# Patient Record
Sex: Female | Born: 1995 | Race: White | Hispanic: Yes | Marital: Single | Smoking: Never smoker
Health system: Southern US, Community
[De-identification: ages and names within clinical notes are randomized; demographics above are authoritative.]

## PROBLEM LIST (undated history)

## (undated) DIAGNOSIS — J302 Other seasonal allergic rhinitis: Secondary | ICD-10-CM

## (undated) DIAGNOSIS — K219 Gastro-esophageal reflux disease without esophagitis: Secondary | ICD-10-CM

## (undated) HISTORY — PX: NO PAST SURGERIES: SHX2092

---
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
## 2017-01-18 ENCOUNTER — Emergency Department (HOSPITAL_COMMUNITY): Payer: Medicaid Other

## 2017-01-18 ENCOUNTER — Emergency Department (HOSPITAL_COMMUNITY)
Admission: EM | Admit: 2017-01-18 | Discharge: 2017-01-18 | Disposition: A | Discharge status: Home / Self Care | Payer: Medicaid Other | Attending: Emergency Medicine | Admitting: Emergency Medicine

## 2017-01-18 ENCOUNTER — Encounter (HOSPITAL_COMMUNITY): Payer: Self-pay | Admitting: *Deleted

## 2017-01-18 DIAGNOSIS — Z79899 Other long term (current) drug therapy: Secondary | ICD-10-CM | POA: Insufficient documentation

## 2017-01-18 DIAGNOSIS — Z3A01 Less than 8 weeks gestation of pregnancy: Secondary | ICD-10-CM | POA: Insufficient documentation

## 2017-01-18 DIAGNOSIS — N939 Abnormal uterine and vaginal bleeding, unspecified: Secondary | ICD-10-CM

## 2017-01-18 DIAGNOSIS — O200 Threatened abortion: Secondary | ICD-10-CM | POA: Diagnosis not present

## 2017-01-18 DIAGNOSIS — O208 Other hemorrhage in early pregnancy: Secondary | ICD-10-CM | POA: Diagnosis present

## 2017-01-18 LAB — URINALYSIS, ROUTINE W REFLEX MICROSCOPIC
Bilirubin Urine: NEGATIVE
GLUCOSE, UA: NEGATIVE mg/dL
KETONES UR: NEGATIVE mg/dL
Leukocytes, UA: NEGATIVE
NITRITE: NEGATIVE
PROTEIN: NEGATIVE mg/dL
Specific Gravity, Urine: 1.015 (ref 1.005–1.030)
pH: 7 (ref 5.0–8.0)

## 2017-01-18 LAB — POC URINE PREG, ED: PREG TEST UR: POSITIVE — AB

## 2017-01-18 LAB — WET PREP, GENITAL
Sperm: NONE SEEN
TRICH WET PREP: NONE SEEN
Yeast Wet Prep HPF POC: NONE SEEN

## 2017-01-18 LAB — HCG, QUANTITATIVE, PREGNANCY: hCG, Beta Chain, Quant, S: 13232 m[IU]/mL — ABNORMAL HIGH (ref <5)

## 2017-01-18 LAB — ABO/RH: ABO/RH(D): B POS

## 2017-01-18 NOTE — ED Triage Notes (Signed)
Pt noticed vaginal bleeding when she wiped after urinating today. Pt states she has some lower abdominal cramping today as well. Pt is [redacted] weeks pregnant.

## 2017-01-18 NOTE — ED Notes (Signed)
Pt aware awaiting for RH screening to result.

## 2017-01-18 NOTE — Discharge Instructions (Signed)
Make an appointment at the [facility] outpatient clinic to get a repeat ultrasound and repeat hCG test in 2 weeks. Your blood type is B+. Please memorize this. If you develop worsening bleeding, cramping, lightheadedness go immediately to the maternity admissions unit at [facility]

## 2017-01-18 NOTE — ED Provider Notes (Signed)
[facility]-EMERGENCY DEPT Provider Note   CSN: [id number] Arrival date & time: 01/18/17  1419     History   Chief Complaint Chief Complaint  Patient presents with  . Vaginal Bleeding    HPI Casey Manning is a 21 y.o. female.Patient noticed slight amount of vaginal bleeding this morning and had 10 minutes of crampy lower abdominal pain she is presently asymptomatic without treatment no other associated symptoms she reports being [redacted] weeks pregnant. Had first prenatal visit yesterday. No other associated symptoms.  HPI  Past Medical History:  Diagnosis Date  . Seasonal allergies     There are no active problems to display for this patient.   History reviewed. No pertinent surgical history.  OB History    Gravida Para Term Preterm AB Living   1             SAB TAB Ectopic Multiple Live Births                   Home Medications    Prior to Admission medications   Medication Sig Start Date End Date Taking? Authorizing Provider  ibuprofen (ADVIL,MOTRIN) 600 MG tablet Take 1 tablet (600 mg total) by mouth every 8 (eight) hours as needed for moderate pain. 12/05/16  Yes Shaune Pollack, MD  Prenatal Vit-Fe Fumarate-FA (PRENATAL 1 PLUS 1 PO) Take 1 tablet by mouth daily.   Yes Historical Provider, MD    Family History No family history on file.  Social History Social History  Substance Use Topics  . Smoking status: Never Smoker  . Smokeless tobacco: Never Used  . Alcohol use No   Ex-smoker quit a few weeks ago no drug use no alcohol  Allergies   Patient has no known allergies.   Review of Systems Review of Systems  Constitutional: Negative.   HENT: Negative.   Respiratory: Negative.   Cardiovascular: Negative.   Gastrointestinal: Positive for abdominal pain.       Low abdominal pain-resolved  Genitourinary: Positive for vaginal bleeding.       Pregnant  Musculoskeletal: Negative.   Skin: Negative.   Neurological: Negative.   Psychiatric/Behavioral:  Negative.   All other systems reviewed and are negative.    Physical Exam Updated Vital Signs BP (!) 121/48 (BP Location: Right Arm)   Pulse 73   Temp 97.8 F (36.6 C) (Oral)   Resp 18   Ht  (1.575 m)   Wt 191 lb 9 oz (86.9 kg)   LMP 12/04/2016   SpO2 100%   BMI 35.04 kg/m   Physical Exam  Constitutional: She appears well-developed and well-nourished.  HENT:  Head: Normocephalic and atraumatic.  Eyes: Conjunctivae are normal. Pupils are equal, round, and reactive to light.  Neck: Neck supple. No tracheal deviation present. No thyromegaly present.  Cardiovascular: Normal rate and regular rhythm.   No murmur heard. Pulmonary/Chest: Effort normal and breath sounds normal.  Abdominal: Soft. Bowel sounds are normal. She exhibits no distension. There is no tenderness.  Genitourinary:  Genitourinary Comments: Pelvic exam no external lesion. Cervical os closed. No blood or discharge in vault. No cervical motion tenderness no adnexal masses or tenderness  Musculoskeletal: Normal range of motion. She exhibits no edema or tenderness.  Neurological: She is alert. Coordination normal.  Skin: Skin is warm and dry. No rash noted.  Psychiatric: She has a normal mood and affect.  Nursing note and vitals reviewed.    ED Treatments / Results  Labs (all labs ordered are listed,  but only abnormal results are displayed) Labs Reviewed  WET PREP, GENITAL - Abnormal; Notable for the following:       Result Value   Clue Cells Wet Prep HPF POC PRESENT (*)    WBC, Wet Prep HPF POC FEW (*)    All other components within normal limits  URINALYSIS, ROUTINE W REFLEX MICROSCOPIC - Abnormal; Notable for the following:    Hgb urine dipstick MODERATE (*)    Bacteria, UA RARE (*)    Squamous Epithelial / LPF 6-30 (*)    All other components within normal limits  HCG, QUANTITATIVE, PREGNANCY - Abnormal; Notable for the following:    hCG, Beta Chain, Quant, S 13,232 (*)    All other components  within normal limits  POC URINE PREG, ED - Abnormal; Notable for the following:    Preg Test, Ur POSITIVE (*)    All other components within normal limits  RPR  HIV ANTIBODY (ROUTINE TESTING)  ABO/RH  GC/CHLAMYDIA PROBE AMP ([place]) NOT AT [facility]    EKG  EKG Interpretation None       Radiology US Ob Comp < 14 Wks  Result Date: 01/18/2017 CLINICAL DATA:  Vaginal bleeding and cramping beginning today. Gestational age by LMP of 6 weeks 3 days. EXAM: OBSTETRIC <14 WK [place] AND TRANSVAGINAL OB US TECHNIQUE: Both transabdominal and transvaginal ultrasound examinations were performed for complete evaluation of the gestation as well as the maternal uterus, adnexal regions, and pelvic cul-de-sac. Transvaginal technique was performed to assess early pregnancy. COMPARISON:  None. FINDINGS: Intrauterine gestational sac: Single Yolk sac:  Visualized. Embryo:  Not Visualized. MSD: 11  mm   5 w   6  d Subchorionic hemorrhage:  None visualized. Maternal uterus/adnexae: Normal appearance of left ovary. Mildly complex right ovarian cyst measuring 2.1 cm most likely represents a small corpus luteum. Other small benign-appearing right ovarian follicular cyst noted. No adnexal mass or abnormal free fluid identified. IMPRESSION: Single intrauterine gestational sac, with estimated gestational age of [redacted] weeks 6 days by mean sac diameter. Recommend follow-up quantitative B-HCG levels and follow-up US in 14 days to assess viability. This recommendation follows SRU consensus guidelines: Diagnostic Criteria for Nonviable Pregnancy Early in the First Trimester. Malva Limes Med 2013; 161:0960-45. Electronically Signed   By: Myles Rosenthal M.D.   On: 01/18/2017 16:24   US Ob Transvaginal  Result Date: 01/18/2017 CLINICAL DATA:  Vaginal bleeding and cramping beginning today. Gestational age by LMP of 6 weeks 3 days. EXAM: OBSTETRIC <14 WK [place] AND TRANSVAGINAL OB US TECHNIQUE: Both transabdominal and transvaginal ultrasound  examinations were performed for complete evaluation of the gestation as well as the maternal uterus, adnexal regions, and pelvic cul-de-sac. Transvaginal technique was performed to assess early pregnancy. COMPARISON:  None. FINDINGS: Intrauterine gestational sac: Single Yolk sac:  Visualized. Embryo:  Not Visualized. MSD: 11  mm   5 w   6  d Subchorionic hemorrhage:  None visualized. Maternal uterus/adnexae: Normal appearance of left ovary. Mildly complex right ovarian cyst measuring 2.1 cm most likely represents a small corpus luteum. Other small benign-appearing right ovarian follicular cyst noted. No adnexal mass or abnormal free fluid identified. IMPRESSION: Single intrauterine gestational sac, with estimated gestational age of [redacted] weeks 6 days by mean sac diameter. Recommend follow-up quantitative B-HCG levels and follow-up US in 14 days to assess viability. This recommendation follows SRU consensus guidelines: Diagnostic Criteria for Nonviable Pregnancy Early in the First Trimester. Malva Limes Med 2013; 409:8119-14. Electronically  Signed   By: Myles Rosenthal M.D.   On: 01/18/2017 16:24    Procedures Procedures (including critical care time)  Medications Ordered in ED Medications - No data to display  Results for orders placed or performed during the hospital encounter of 01/18/17  Wet prep, genital  Result Value Ref Range   Yeast Wet Prep HPF POC NONE SEEN NONE SEEN   Trich, Wet Prep NONE SEEN NONE SEEN   Clue Cells Wet Prep HPF POC PRESENT (A) NONE SEEN   WBC, Wet Prep HPF POC FEW (A) NONE SEEN   Sperm NONE SEEN   Urinalysis, Routine w reflex microscopic  Result Value Ref Range   Color, Urine YELLOW YELLOW   APPearance CLEAR CLEAR   Specific Gravity, Urine 1.015 1.005 - 1.030   pH 7.0 5.0 - 8.0   Glucose, UA NEGATIVE NEGATIVE mg/dL   Hgb urine dipstick MODERATE (A) NEGATIVE   Bilirubin Urine NEGATIVE NEGATIVE   Ketones, ur NEGATIVE NEGATIVE mg/dL   Protein, ur NEGATIVE NEGATIVE mg/dL    Nitrite NEGATIVE NEGATIVE   Leukocytes, UA NEGATIVE NEGATIVE   RBC / HPF 0-5 0 - 5 RBC/hpf   WBC, UA 0-5 0 - 5 WBC/hpf   Bacteria, UA RARE (A) NONE SEEN   Squamous Epithelial / LPF 6-30 (A) NONE SEEN   Mucous PRESENT   hCG, quantitative, pregnancy  Result Value Ref Range   hCG, Beta Chain, Quant, S 13,232 (H) <5 mIU/mL  POC urine preg, ED  Result Value Ref Range   Preg Test, Ur POSITIVE (A) NEGATIVE  ABO/Rh  Result Value Ref Range   ABO/RH(D) B POS    US Ob Comp < 14 Wks  Result Date: 01/18/2017 CLINICAL DATA:  Vaginal bleeding and cramping beginning today. Gestational age by LMP of 6 weeks 3 days. EXAM: OBSTETRIC <14 WK [place] AND TRANSVAGINAL OB US TECHNIQUE: Both transabdominal and transvaginal ultrasound examinations were performed for complete evaluation of the gestation as well as the maternal uterus, adnexal regions, and pelvic cul-de-sac. Transvaginal technique was performed to assess early pregnancy. COMPARISON:  None. FINDINGS: Intrauterine gestational sac: Single Yolk sac:  Visualized. Embryo:  Not Visualized. MSD: 11  mm   5 w   6  d Subchorionic hemorrhage:  None visualized. Maternal uterus/adnexae: Normal appearance of left ovary. Mildly complex right ovarian cyst measuring 2.1 cm most likely represents a small corpus luteum. Other small benign-appearing right ovarian follicular cyst noted. No adnexal mass or abnormal free fluid identified. IMPRESSION: Single intrauterine gestational sac, with estimated gestational age of [redacted] weeks 6 days by mean sac diameter. Recommend follow-up quantitative B-HCG levels and follow-up US in 14 days to assess viability. This recommendation follows SRU consensus guidelines: Diagnostic Criteria for Nonviable Pregnancy Early in the First Trimester. Malva Limes Med 2013; 161:0960-45. Electronically Signed   By: Myles Rosenthal M.D.   On: 01/18/2017 16:24   US Ob Transvaginal  Result Date: 01/18/2017 CLINICAL DATA:  Vaginal bleeding and cramping beginning today.  Gestational age by LMP of 6 weeks 3 days. EXAM: OBSTETRIC <14 WK [place] AND TRANSVAGINAL OB US TECHNIQUE: Both transabdominal and transvaginal ultrasound examinations were performed for complete evaluation of the gestation as well as the maternal uterus, adnexal regions, and pelvic cul-de-sac. Transvaginal technique was performed to assess early pregnancy. COMPARISON:  None. FINDINGS: Intrauterine gestational sac: Single Yolk sac:  Visualized. Embryo:  Not Visualized. MSD: 11  mm   5 w   6  d Subchorionic hemorrhage:  None visualized.  Maternal uterus/adnexae: Normal appearance of left ovary. Mildly complex right ovarian cyst measuring 2.1 cm most likely represents a small corpus luteum. Other small benign-appearing right ovarian follicular cyst noted. No adnexal mass or abnormal free fluid identified. IMPRESSION: Single intrauterine gestational sac, with estimated gestational age of [redacted] weeks 6 days by mean sac diameter. Recommend follow-up quantitative B-HCG levels and follow-up US in 14 days to assess viability. This recommendation follows SRU consensus guidelines: Diagnostic Criteria for Nonviable Pregnancy Early in the First Trimester. Malva Limes Med 2013; 161:0960-45. Electronically Signed   By: Myles Rosenthal M.D.   On: 01/18/2017 16:24   Initial Impression / Assessment and Plan / ED Course  I have reviewed the triage vital signs and the nursing notes.  Pertinent labs & imaging results that were available during my care of the patient were reviewed by me and considered in my medical decision making (see chart for details).     5 PM patient asymptomatic resting comfortably. No further vaginal bleeding. Plan follow-up with women's outpatient clinic. Referred to maternity admissions unit if she has any other issues which she feels may be related to pregnancy plan follow up at [facility] outpatient clinic. Pelvic rest. Final Clinical Impressions(s) / ED Diagnoses   Final diagnoses:  Vaginal bleeding   Diagnosis  threatened miscarriage New Prescriptions New Prescriptions   No medications on file     Doug Sou, MD 01/18/17 1708

## 2017-01-19 LAB — RPR: RPR Ser Ql: NONREACTIVE

## 2017-01-20 LAB — HIV ANTIBODY (ROUTINE TESTING W REFLEX): HIV SCREEN 4TH GENERATION: NONREACTIVE

## 2017-01-20 LAB — GC/CHLAMYDIA PROBE AMP (~~LOC~~) NOT AT ARMC
CHLAMYDIA, DNA PROBE: NEGATIVE
Neisseria Gonorrhea: NEGATIVE

## 2017-03-31 LAB — OB RESULTS CONSOLE HIV ANTIBODY (ROUTINE TESTING): HIV: NONREACTIVE

## 2017-03-31 LAB — OB RESULTS CONSOLE ANTIBODY SCREEN: Antibody Screen: NEGATIVE

## 2017-03-31 LAB — OB RESULTS CONSOLE GC/CHLAMYDIA
Chlamydia: NEGATIVE
Gonorrhea: NEGATIVE

## 2017-03-31 LAB — OB RESULTS CONSOLE RPR: RPR: NONREACTIVE

## 2017-03-31 LAB — OB RESULTS CONSOLE ABO/RH: RH Type: POSITIVE

## 2017-03-31 LAB — OB RESULTS CONSOLE VARICELLA ZOSTER ANTIBODY, IGG: Varicella: IMMUNE

## 2017-03-31 LAB — OB RESULTS CONSOLE PLATELET COUNT: PLATELETS: 300

## 2017-03-31 LAB — OB RESULTS CONSOLE RUBELLA ANTIBODY, IGM: Rubella: IMMUNE

## 2017-03-31 LAB — OB RESULTS CONSOLE HEPATITIS B SURFACE ANTIGEN: HEP B S AG: NEGATIVE

## 2017-03-31 LAB — OB RESULTS CONSOLE HGB/HCT, BLOOD
HCT: 37
HEMOGLOBIN: 11.9

## 2017-03-31 LAB — SICKLE CELL SCREEN: SICKLE CELL SCREEN: NORMAL

## 2017-03-31 LAB — CYSTIC FIBROSIS DIAGNOSTIC STUDY: Interpretation-CFDNA:: NEGATIVE

## 2017-07-18 LAB — OB RESULTS CONSOLE HGB/HCT, BLOOD
HCT: 35
HEMOGLOBIN: 11.6

## 2017-07-18 LAB — OB RESULTS CONSOLE RPR: RPR: NONREACTIVE

## 2017-07-31 ENCOUNTER — Encounter: Payer: Self-pay | Admitting: *Deleted

## 2017-07-31 ENCOUNTER — Ambulatory Visit: Payer: Medicaid Other | Admitting: Clinical

## 2017-07-31 ENCOUNTER — Encounter: Payer: Self-pay | Admitting: Family Medicine

## 2017-07-31 ENCOUNTER — Ambulatory Visit (INDEPENDENT_AMBULATORY_CARE_PROVIDER_SITE_OTHER): Payer: Medicaid Other | Admitting: Family Medicine

## 2017-07-31 VITALS — BP 105/46 | HR 61 | Wt 206.1 lb

## 2017-07-31 DIAGNOSIS — O403XX0 Polyhydramnios, third trimester, not applicable or unspecified: Secondary | ICD-10-CM | POA: Insufficient documentation

## 2017-07-31 DIAGNOSIS — F329 Major depressive disorder, single episode, unspecified: Secondary | ICD-10-CM | POA: Insufficient documentation

## 2017-07-31 DIAGNOSIS — O99343 Other mental disorders complicating pregnancy, third trimester: Secondary | ICD-10-CM

## 2017-07-31 DIAGNOSIS — O0990 Supervision of high risk pregnancy, unspecified, unspecified trimester: Secondary | ICD-10-CM

## 2017-07-31 DIAGNOSIS — F4321 Adjustment disorder with depressed mood: Secondary | ICD-10-CM

## 2017-07-31 DIAGNOSIS — F32A Depression, unspecified: Secondary | ICD-10-CM | POA: Insufficient documentation

## 2017-07-31 DIAGNOSIS — O0993 Supervision of high risk pregnancy, unspecified, third trimester: Secondary | ICD-10-CM

## 2017-07-31 DIAGNOSIS — Z23 Encounter for immunization: Secondary | ICD-10-CM

## 2017-07-31 DIAGNOSIS — R8271 Bacteriuria: Secondary | ICD-10-CM

## 2017-07-31 DIAGNOSIS — O409XX0 Polyhydramnios, unspecified trimester, not applicable or unspecified: Secondary | ICD-10-CM

## 2017-07-31 DIAGNOSIS — O99340 Other mental disorders complicating pregnancy, unspecified trimester: Secondary | ICD-10-CM

## 2017-07-31 DIAGNOSIS — F1290 Cannabis use, unspecified, uncomplicated: Secondary | ICD-10-CM

## 2017-07-31 LAB — DRUG SCREEN, URINE: DRUG SCREEN, URINE: POSITIVE

## 2017-07-31 LAB — CULTURE, OB URINE: URINE CULTURE, OB: POSITIVE

## 2017-07-31 LAB — GLUCOSE, 1 HOUR
Glucose 1 Hour: 104
Glucose 1 Hour: 146

## 2017-07-31 LAB — GLUCOSE, 3 HOUR

## 2017-07-31 LAB — AFP TETRA: MSAFP: NEGATIVE

## 2017-07-31 NOTE — Progress Notes (Signed)
   PRENATAL VISIT NOTE  Subjective:  Casey Manning is a 21 y.o. G1P0 at 1237w6d being seen today for initial prenatal care. [facility] and transferred to our office due to polyhydramnios. She is currently monitored for the following issues for this high-risk pregnancy and has Supervision of high risk pregnancy, antepartum; GBS bacteriuria; Marijuana use; Polyhydramnios affecting pregnancy; and Depression affecting pregnancy on her problem list.  Patient reports occasional cramps and round ligament pain.  Contractions: Not present. Vag. Bleeding: None.  Movement: Present. Denies leaking of fluid.   The following portions of the patient's history were reviewed and updated as appropriate: allergies, current medications, past family history, past medical history, past social history, past surgical history and problem list. Problem list updated.  Objective:   Vitals:   07/31/17 1459  BP: (!) 105/46  Pulse: 61  Weight: 206 lb 1.6 oz (93.5 kg)    Fetal Status: Fetal Heart Rate (bpm): 141   Movement: Present     General:  Alert, oriented and cooperative. Patient is in no acute distress.  Skin: Skin is warm and dry. No rash noted.   Cardiovascular: Normal heart rate noted  Respiratory: Normal respiratory effort, no problems with respiration noted  Abdomen: Soft, gravid, appropriate for gestational age.  Pain/Pressure: Present     Pelvic: Cervical exam deferred        Extremities: Normal range of motion.  Edema: Trace  Mental Status:  Normal mood and affect. Normal behavior. Normal judgment and thought content.   Assessment and Plan:  Pregnancy: G1P0 at 3137w6d  1. Supervision of high risk pregnancy, antepartum FHT normal  2. Depression affecting pregnancy Asher MuirJamie to see - Ambulatory referral to [facility]  3. Polyhydramnios affecting pregnancy Start antenatal testing - US MFM FETAL BPP WO NON STRESS; Future  4. Need for immunization against influenza -  Flu Vaccine QUAD 6+ mos IM (Fluarix)  5. Marijuana use Discussed SW postpartum  6. GBS bacteriuria Intrapartum prophylaxis  Preterm labor symptoms and general obstetric precautions including but not limited to vaginal bleeding, contractions, leaking of fluid and fetal movement were reviewed in detail with the patient. Please refer to After Visit Summary for other counseling recommendations.  No Follow-up on file.   Levie HeritageJacob J Mavis Fichera, DO

## 2017-07-31 NOTE — BH Specialist Note (Signed)
Integrated Behavioral Health Initial Visit  MRN: [id number] Name: Casey Manning  Number of Integrated Behavioral Health Clinician visits:: 1/6 Session Start time: 3:35  Session End time: 3:47 Total time: 15 minutes  Type of Service: Integrated Behavioral Health- Individual/Family Interpretor:No. Interpretor Name and Language: n/a   Warm Hand Off Completed.       SUBJECTIVE: Casey Manning is a 21 y.o. female accompanied by n/a Patient was referred by Dr Adrian BlackwaterStinson for depression. Patient reports the following symptoms/concerns: Pt states her primary concern today is "life stuff with my family, I just deal with it", along with physical aches and pains of pregnancy, but does not feel that changes are needed at this time. Pt interested in peer support postpartum. Pt denies current substances. Duration of problem: Undetermined; Severity of problem: moderate  OBJECTIVE: Mood: Euphoric and Affect: Inappropriate Risk of harm to self or others: No plan to harm self or others  LIFE CONTEXT: Family and Social: Pt says her mother is her greatest suppport; FOB may be supportive School/Work: - Self-Care: Denies current substances Life Changes: Current pregnancy   GOALS ADDRESSED: Patient will: 1. Reduce symptoms of: depression 2. Increase knowledge and/or ability of: self-management skills  3. Demonstrate ability to: Increase healthy adjustment to current life circumstances and Increase adequate support systems for patient/family  INTERVENTIONS: Interventions utilized: Supportive Counseling, Psychoeducation and/or Health Education and Link to [facility]  Standardized Assessments completed: GAD-7 and PHQ 9  ASSESSMENT: Patient currently experiencing Adjustment disorder with depressed mood.   Patient may benefit from psychoeducation and supportive counseling regarding current life stress and symptoms of depression  PLAN: 1. Follow up with behavioral health  clinician on : As needed 2. Behavioral recommendations:  -Consider reading educational materials regarding coping with symptoms of depression -Consider [employer] app for additional self-care 3. Referral(s): [facility] 4. "From scale of 1-10, how likely are you to follow plan?": 6  Rae LipsJamie C McMannes, LCSWA  Dep[facility] 07/31/2017  Decreased Interest 0  Down, Depressed, Hopeless 2  PHQ - 2 Score 2  Altered sleeping 2  Tired, decreased energy 2  Change in appetite 0  Feeling bad or failure about yourself  2  Trouble concentrating 2  Moving slowly or fidgety/restless 2  Suicidal thoughts 0  PHQ-9 Score 12

## 2017-08-01 ENCOUNTER — Other Ambulatory Visit (HOSPITAL_COMMUNITY): Payer: Self-pay | Admitting: *Deleted

## 2017-08-01 ENCOUNTER — Encounter (HOSPITAL_COMMUNITY): Payer: Self-pay

## 2017-08-01 ENCOUNTER — Ambulatory Visit (HOSPITAL_COMMUNITY)
Admission: RE | Admit: 2017-08-01 | Discharge: 2017-08-01 | Disposition: A | Discharge status: Home / Self Care | Payer: Medicaid Other | Source: Ambulatory Visit | Attending: Family Medicine | Admitting: Family Medicine

## 2017-08-01 DIAGNOSIS — O99213 Obesity complicating pregnancy, third trimester: Secondary | ICD-10-CM | POA: Diagnosis not present

## 2017-08-01 DIAGNOSIS — Z3A34 34 weeks gestation of pregnancy: Secondary | ICD-10-CM | POA: Diagnosis not present

## 2017-08-01 DIAGNOSIS — Z6838 Body mass index (BMI) 38.0-38.9, adult: Secondary | ICD-10-CM | POA: Insufficient documentation

## 2017-08-01 DIAGNOSIS — O403XX0 Polyhydramnios, third trimester, not applicable or unspecified: Secondary | ICD-10-CM | POA: Insufficient documentation

## 2017-08-01 DIAGNOSIS — O409XX0 Polyhydramnios, unspecified trimester, not applicable or unspecified: Secondary | ICD-10-CM

## 2017-08-05 ENCOUNTER — Encounter: Payer: Medicaid Other | Admitting: Obstetrics & Gynecology

## 2017-08-05 ENCOUNTER — Other Ambulatory Visit: Payer: Medicaid Other

## 2017-08-06 ENCOUNTER — Encounter: Payer: Self-pay | Admitting: Obstetrics & Gynecology

## 2017-08-07 ENCOUNTER — Encounter: Payer: Medicaid Other | Admitting: Obstetrics & Gynecology

## 2017-08-13 ENCOUNTER — Encounter: Payer: Medicaid Other | Admitting: Family Medicine

## 2017-08-13 ENCOUNTER — Encounter: Payer: Medicaid Other | Admitting: Advanced Practice Midwife

## 2017-08-14 ENCOUNTER — Encounter: Payer: Medicaid Other | Admitting: Obstetrics and Gynecology

## 2017-08-14 ENCOUNTER — Other Ambulatory Visit: Payer: Self-pay

## 2017-08-15 ENCOUNTER — Telehealth: Payer: Self-pay | Admitting: *Deleted

## 2017-08-15 NOTE — Telephone Encounter (Addendum)
Called pt w/[employer] interpreter [phone] to discuss her missed appt on 11/1. A female answered and hung up when the interpreter introduced himself. I then called pt's home # and her mother answered.  She stated that Casey Manning is available via cell phone. I informed her that the line was answered but then a female hung up. She said the cell phone was likely answered by [name]'s husband who does not speak [place]. Casey Manning also speaks [place] and does not require interpreter. I called back on the cell # and a female once again answered but did not respond when I introduced myself speaking in [place]. Attempt to reach pt will be made again on 11/5.  11/14  Per chart review, pt is scheduled for US and Ob visit tomorrow.

## 2017-08-28 ENCOUNTER — Ambulatory Visit (INDEPENDENT_AMBULATORY_CARE_PROVIDER_SITE_OTHER): Payer: Medicaid Other | Admitting: Family Medicine

## 2017-08-28 ENCOUNTER — Other Ambulatory Visit (HOSPITAL_COMMUNITY): Payer: Self-pay | Admitting: Maternal and Fetal Medicine

## 2017-08-28 ENCOUNTER — Encounter: Payer: Self-pay | Admitting: Obstetrics & Gynecology

## 2017-08-28 ENCOUNTER — Ambulatory Visit (HOSPITAL_COMMUNITY)
Admission: RE | Admit: 2017-08-28 | Discharge: 2017-08-28 | Disposition: A | Discharge status: Home / Self Care | Payer: Medicaid Other | Source: Ambulatory Visit | Attending: Family Medicine | Admitting: Family Medicine

## 2017-08-28 ENCOUNTER — Other Ambulatory Visit (HOSPITAL_COMMUNITY)
Admission: RE | Admit: 2017-08-28 | Discharge: 2017-08-28 | Disposition: A | Discharge status: Home / Self Care | Payer: Medicaid Other | Source: Ambulatory Visit | Attending: Family Medicine | Admitting: Family Medicine

## 2017-08-28 ENCOUNTER — Ambulatory Visit (HOSPITAL_COMMUNITY): Admission: RE | Admit: 2017-08-28 | Payer: Medicaid Other | Source: Ambulatory Visit

## 2017-08-28 VITALS — BP 119/64 | HR 83 | Wt 213.8 lb

## 2017-08-28 DIAGNOSIS — Z3A37 37 weeks gestation of pregnancy: Secondary | ICD-10-CM | POA: Insufficient documentation

## 2017-08-28 DIAGNOSIS — O0993 Supervision of high risk pregnancy, unspecified, third trimester: Secondary | ICD-10-CM | POA: Diagnosis not present

## 2017-08-28 DIAGNOSIS — Z363 Encounter for antenatal screening for malformations: Secondary | ICD-10-CM

## 2017-08-28 DIAGNOSIS — Z23 Encounter for immunization: Secondary | ICD-10-CM

## 2017-08-28 DIAGNOSIS — O403XX0 Polyhydramnios, third trimester, not applicable or unspecified: Secondary | ICD-10-CM | POA: Insufficient documentation

## 2017-08-28 DIAGNOSIS — O99340 Other mental disorders complicating pregnancy, unspecified trimester: Secondary | ICD-10-CM

## 2017-08-28 DIAGNOSIS — R8271 Bacteriuria: Secondary | ICD-10-CM

## 2017-08-28 DIAGNOSIS — F32A Depression, unspecified: Secondary | ICD-10-CM

## 2017-08-28 DIAGNOSIS — O0990 Supervision of high risk pregnancy, unspecified, unspecified trimester: Secondary | ICD-10-CM

## 2017-08-28 DIAGNOSIS — Z331 Pregnant state, incidental: Secondary | ICD-10-CM | POA: Diagnosis not present

## 2017-08-28 DIAGNOSIS — Z113 Encounter for screening for infections with a predominantly sexual mode of transmission: Secondary | ICD-10-CM

## 2017-08-28 DIAGNOSIS — F1290 Cannabis use, unspecified, uncomplicated: Secondary | ICD-10-CM

## 2017-08-28 DIAGNOSIS — H1031 Unspecified acute conjunctivitis, right eye: Secondary | ICD-10-CM

## 2017-08-28 DIAGNOSIS — F329 Major depressive disorder, single episode, unspecified: Secondary | ICD-10-CM

## 2017-08-28 MED ORDER — GENTAMICIN SULFATE 0.3 % OP SOLN: 1.0000 [drp] | OPHTHALMIC | 0 refills | Status: DC

## 2017-08-28 NOTE — Procedures (Signed)
Doree AlbeeMaria Villatoro-Medrano 24-May-1996 [id number]  Fetus A Non-Stress Test Interpretation for 08/28/17  Indication: Unsatisfactory BPP  Fetal Heart Rate A Mode: External Baseline Rate (A): 135 bpm Variability: Moderate Accelerations: 10 x 10, 15 x 15 Decelerations: None Multiple birth?: No  Uterine Activity Mode: Palpation, Toco Contraction Frequency (min): 3-7 Contraction Duration (sec): 50-80 Contraction Quality: Mild Resting Tone Palpated: Relaxed Resting Time: Adequate  Interpretation (Fetal Testing) Nonstress Test Interpretation: Reactive Overall Impression: Reassuring for gestational age Comments: Reviewed tracing with Dr. Sherrie Georgeecker

## 2017-08-28 NOTE — Patient Instructions (Signed)
Group B Streptococcus Infection During Pregnancy Group B Streptococcus (GBS) is a type of bacteria (Streptococcus agalactiae) that is often found in healthy people, commonly in the rectum, vagina, and intestines. In people who are healthy and not pregnant, the bacteria rarely cause serious illness or complications. However, women who test positive for GBS during pregnancy can pass the bacteria to their baby during childbirth, which can cause serious infection in the baby after birth. Women with GBS may also have infections during their pregnancy or immediately after childbirth, such as such as urinary tract infections (UTIs) or infections of the uterus (uterine infections). Having GBS also increases a woman's risk of complications during pregnancy, such as early (preterm) labor or delivery, miscarriage, or stillbirth. Routine testing (screening) for GBS is recommended for all pregnant women. What increases the risk? You may have a higher risk for GBS infection during pregnancy if you had one during a past pregnancy. What are the signs or symptoms? In most cases, GBS infection does not cause symptoms in pregnant women. Signs and symptoms of a possible GBS-related infection may include:  Labor starting before the 37th week of pregnancy.  A UTI or bladder infection, which may cause: ? Fever. ? Pain or burning during urination. ? Frequent urination.  Fever during labor, along with: ? Bad-smelling discharge. ? Uterine tenderness. ? Rapid heartbeat in the mother, baby, or both.  Rare but serious symptoms of a possible GBS-related infection in women include:  Blood infection (septicemia). This may cause fever, chills, or confusion.  Lung infection (pneumonia). This may cause fever, chills, cough, rapid breathing, difficulty breathing, or chest pain.  Bone, joint, skin, or soft tissue infection.  How is this diagnosed? You may be screened for GBS between week 35 and week 37 of your  pregnancy. If you have symptoms of preterm labor, you may be screened earlier. This condition is diagnosed based on lab test results from:  A swab of fluid from the vagina and rectum.  A urine sample.  How is this treated? This condition is treated with antibiotic medicine. When you go into labor, or as soon as your water breaks (your membranes rupture), you will be given antibiotics through an IV tube. Antibiotics will continue until after you give birth. If you are having a cesarean delivery, you do not need antibiotics unless your membranes have already ruptured. Follow these instructions at home:  Take over-the-counter and prescription medicines only as told by your health care provider.  Take your antibiotic medicine as told by your health care provider. Do not stop taking the antibiotic even if you start to feel better.  Keep all pre-birth (prenatal) visits and follow-up visits as told by your health care provider. This is important. Contact a health care provider if:  You have pain or burning when you urinate.  You have to urinate frequently.  You have a fever or chills.  You develop a bad-smelling vaginal discharge. Get help right away if:  Your membranes rupture.  You go into labor.  You have severe pain in your abdomen.  You have difficulty breathing.  You have chest pain. This information is not intended to replace advice given to you by your health care provider. Make sure you discuss any questions you have with your health care provider. Document Released: 01/07/2008 Document Revised: 04/26/2016 Document Reviewed: 04/25/2016 Elsevier Interactive Patient Education  2018 [employer]   Breastfeeding Deciding to breastfeed is one of the best choices you can make for you and  your baby. A change in hormones during pregnancy causes your breast tissue to grow and increases the number and size of your milk ducts. These hormones also allow proteins, sugars, and fats  from your blood supply to make breast milk in your milk-producing glands. Hormones prevent breast milk from being released before your baby is born as well as prompt milk flow after birth. Once breastfeeding has begun, thoughts of your baby, as well as his or her sucking or crying, can stimulate the release of milk from your milk-producing glands. Benefits of breastfeeding For Your Baby  Your first milk (colostrum) helps your baby's digestive system function better.  There are antibodies in your milk that help your baby fight off infections.  Your baby has a lower incidence of asthma, allergies, and sudden infant death syndrome.  The nutrients in breast milk are better for your baby than infant formulas and are designed uniquely for your baby's needs.  Breast milk improves your baby's brain development.  Your baby is less likely to develop other conditions, such as childhood obesity, asthma, or type 2 diabetes mellitus.  For You  Breastfeeding helps to create a very special bond between you and your baby.  Breastfeeding is convenient. Breast milk is always available at the correct temperature and costs nothing.  Breastfeeding helps to burn calories and helps you lose the weight gained during pregnancy.  Breastfeeding makes your uterus contract to its prepregnancy size faster and slows bleeding (lochia) after you give birth.  Breastfeeding helps to lower your risk of developing type 2 diabetes mellitus, osteoporosis, and breast or ovarian cancer later in life.  Signs that your baby is hungry Early Signs of Hunger  Increased alertness or activity.  Stretching.  Movement of the head from side to side.  Movement of the head and opening of the mouth when the corner of the mouth or cheek is stroked (rooting).  Increased sucking sounds, smacking lips, cooing, sighing, or squeaking.  Hand-to-mouth movements.  Increased sucking of fingers or hands.  Late Signs of  Hunger  Fussing.  Intermittent crying.  Extreme Signs of Hunger Signs of extreme hunger will require calming and consoling before your baby will be able to breastfeed successfully. Do not wait for the following signs of extreme hunger to occur before you initiate breastfeeding:  Restlessness.  A loud, strong cry.  Screaming.  Breastfeeding basics Breastfeeding Initiation  Find a comfortable place to sit or lie down, with your neck and back well supported.  Place a pillow or rolled up blanket under your baby to bring him or her to the level of your breast (if you are seated). Nursing pillows are specially designed to help support your arms and your baby while you breastfeed.  Make sure that your baby's abdomen is facing your abdomen.  Gently massage your breast. With your fingertips, massage from your chest wall toward your nipple in a circular motion. This encourages milk flow. You may need to continue this action during the feeding if your milk flows slowly.  Support your breast with 4 fingers underneath and your thumb above your nipple. Make sure your fingers are well away from your nipple and your baby's mouth.  Stroke your baby's lips gently with your finger or nipple.  When your baby's mouth is open wide enough, quickly bring your baby to your breast, placing your entire nipple and as much of the colored area around your nipple (areola) as possible into your baby's mouth. ? More areola should be  visible above your baby's upper lip than below the lower lip. ? Your baby's tongue should be between his or her lower gum and your breast.  Ensure that your baby's mouth is correctly positioned around your nipple (latched). Your baby's lips should create a seal on your breast and be turned out (everted).  It is common for your baby to suck about 2-3 minutes in order to start the flow of breast milk.  Latching Teaching your baby how to latch on to your breast properly is very  important. An improper latch can cause nipple pain and decreased milk supply for you and poor weight gain in your baby. Also, if your baby is not latched onto your nipple properly, he or she may swallow some air during feeding. This can make your baby fussy. Burping your baby when you switch breasts during the feeding can help to get rid of the air. However, teaching your baby to latch on properly is still the best way to prevent fussiness from swallowing air while breastfeeding. Signs that your baby has successfully latched on to your nipple:  Silent tugging or silent sucking, without causing you pain.  Swallowing heard between every 3-4 sucks.  Muscle movement above and in front of his or her ears while sucking.  Signs that your baby has not successfully latched on to nipple:  Sucking sounds or smacking sounds from your baby while breastfeeding.  Nipple pain.  If you think your baby has not latched on correctly, slip your finger into the corner of your baby's mouth to break the suction and place it between your baby's gums. Attempt breastfeeding initiation again. Signs of Successful Breastfeeding Signs from your baby:  A gradual decrease in the number of sucks or complete cessation of sucking.  Falling asleep.  Relaxation of his or her body.  Retention of a small amount of milk in his or her mouth.  Letting go of your breast by himself or herself.  Signs from you:  Breasts that have increased in firmness, weight, and size 1-3 hours after feeding.  Breasts that are softer immediately after breastfeeding.  Increased milk volume, as well as a change in milk consistency and color by the fifth day of breastfeeding.  Nipples that are not sore, cracked, or bleeding.  Signs That Your Pecola Leisure is Getting Enough Milk  Wetting at least 1-2 diapers during the first 24 hours after birth.  Wetting at least 5-6 diapers every 24 hours for the first week after birth. The urine should be clear  or pale yellow by 5 days after birth.  Wetting 6-8 diapers every 24 hours as your baby continues to grow and develop.  At least 3 stools in a 24-hour period by age 715 days. The stool should be soft and yellow.  At least 3 stools in a 24-hour period by age 71 days. The stool should be seedy and yellow.  No loss of weight greater than 10% of birth weight during the first 47 days of age.  Average weight gain of 4-7 ounces (113-198 g) per week after age 819 days.  Consistent daily weight gain by age 715 days, without weight loss after the age of 2 weeks.  After a feeding, your baby may spit up a small amount. This is common. Breastfeeding frequency and duration Frequent feeding will help you make more milk and can prevent sore nipples and breast engorgement. Breastfeed when you feel the need to reduce the fullness of your breasts or when your baby  shows signs of hunger. This is called "breastfeeding on demand." Avoid introducing a pacifier to your baby while you are working to establish breastfeeding (the first 4-6 weeks after your baby is born). After this time you may choose to use a pacifier. Research has shown that pacifier use during the first year of a baby's life decreases the risk of sudden infant death syndrome (SIDS). Allow your baby to feed on each breast as long as he or she wants. Breastfeed until your baby is finished feeding. When your baby unlatches or falls asleep while feeding from the first breast, offer the second breast. Because newborns are often sleepy in the first few weeks of life, you may need to awaken your baby to get him or her to feed. Breastfeeding times will vary from baby to baby. However, the following rules can serve as a guide to help you ensure that your baby is properly fed:  Newborns (babies 55 weeks of age or younger) may breastfeed every 1-3 hours.  Newborns should not go longer than 3 hours during the day or 5 hours during the night without breastfeeding.  You  should breastfeed your baby a minimum of 8 times in a 24-hour period until you begin to introduce solid foods to your baby at around 57 months of age.  Breast milk pumping Pumping and storing breast milk allows you to ensure that your baby is exclusively fed your breast milk, even at times when you are unable to breastfeed. This is especially important if you are going back to work while you are still breastfeeding or when you are not able to be present during feedings. Your lactation consultant can give you guidelines on how long it is safe to store breast milk. A breast pump is a machine that allows you to pump milk from your breast into a sterile bottle. The pumped breast milk can then be stored in a refrigerator or freezer. Some breast pumps are operated by hand, while others use electricity. Ask your lactation consultant which type will work best for you. Breast pumps can be purchased, but some hospitals and breastfeeding support groups lease breast pumps on a monthly basis. A lactation consultant can teach you how to hand express breast milk, if you prefer not to use a pump. Caring for your breasts while you breastfeed Nipples can become dry, cracked, and sore while breastfeeding. The following recommendations can help keep your breasts moisturized and healthy:  Avoid using soap on your nipples.  Wear a supportive bra. Although not required, special nursing bras and tank tops are designed to allow access to your breasts for breastfeeding without taking off your entire bra or top. Avoid wearing underwire-style bras or extremely tight bras.  Air dry your nipples for 3-69minutes after each feeding.  Use only cotton bra pads to absorb leaked breast milk. Leaking of breast milk between feedings is normal.  Use lanolin on your nipples after breastfeeding. Lanolin helps to maintain your skin's normal moisture barrier. If you use pure lanolin, you do not need to wash it off before feeding your baby  again. Pure lanolin is not toxic to your baby. You may also hand express a few drops of breast milk and gently massage that milk into your nipples and allow the milk to air dry.  In the first few weeks after giving birth, some women experience extremely full breasts (engorgement). Engorgement can make your breasts feel heavy, warm, and tender to the touch. Engorgement peaks within 3-5 days after  you give birth. The following recommendations can help ease engorgement:  Completely empty your breasts while breastfeeding or pumping. You may want to start by applying warm, moist heat (in the shower or with warm water-soaked hand towels) just before feeding or pumping. This increases circulation and helps the milk flow. If your baby does not completely empty your breasts while breastfeeding, pump any extra milk after he or she is finished.  Wear a snug bra (nursing or regular) or tank top for 1-2 days to signal your body to slightly decrease milk production.  Apply ice packs to your breasts, unless this is too uncomfortable for you.  Make sure that your baby is latched on and positioned properly while breastfeeding.  If engorgement persists after 48 hours of following these recommendations, contact your health care provider or a Advertising copywriterlactation consultant. Overall health care recommendations while breastfeeding  Eat healthy foods. Alternate between meals and snacks, eating 3 of each per day. Because what you eat affects your breast milk, some of the foods may make your baby more irritable than usual. Avoid eating these foods if you are sure that they are negatively affecting your baby.  Drink milk, fruit juice, and water to satisfy your thirst (about 10 glasses a day).  Rest often, relax, and continue to take your prenatal vitamins to prevent fatigue, stress, and anemia.  Continue breast self-awareness checks.  Avoid chewing and smoking tobacco. Chemicals from cigarettes that pass into breast milk and  exposure to secondhand smoke may harm your baby.  Avoid alcohol and drug use, including marijuana. Some medicines that may be harmful to your baby can pass through breast milk. It is important to ask your health care provider before taking any medicine, including all over-the-counter and prescription medicine as well as vitamin and herbal supplements. It is possible to become pregnant while breastfeeding. If birth control is desired, ask your health care provider about options that will be safe for your baby. Contact a health care provider if:  You feel like you want to stop breastfeeding or have become frustrated with breastfeeding.  You have painful breasts or nipples.  Your nipples are cracked or bleeding.  Your breasts are red, tender, or warm.  You have a swollen area on either breast.  You have a fever or chills.  You have nausea or vomiting.  You have drainage other than breast milk from your nipples.  Your breasts do not become full before feedings by the fifth day after you give birth.  You feel sad and depressed.  Your baby is too sleepy to eat well.  Your baby is having trouble sleeping.  Your baby is wetting less than 3 diapers in a 24-hour period.  Your baby has less than 3 stools in a 24-hour period.  Your baby's skin or the white part of his or her eyes becomes yellow.  Your baby is not gaining weight by 95 days of age. Get help right away if:  Your baby is overly tired (lethargic) and does not want to wake up and feed.  Your baby develops an unexplained fever. This information is not intended to replace advice given to you by your health care provider. Make sure you discuss any questions you have with your health care provider. Document Released: 09/30/2005 Document Revised: 03/13/2016 Document Reviewed: 03/24/2013 Elsevier Interactive Patient Education  2017 [employer]

## 2017-08-28 NOTE — Progress Notes (Signed)
   PRENATAL VISIT NOTE  Subjective:  Casey Manning is a 21 y.o. G1P0 at 5884w6d being seen today for ongoing prenatal care.  She is currently monitored for the following issues for this high-risk pregnancy and has Supervision of high risk pregnancy, antepartum; GBS bacteriuria; Marijuana use; Polyhydramnios in third trimester, antepartum complication; Depression affecting pregnancy; and Migraine without aura and without status migrainosus, not intractable on their problem list.  Patient reports itchy, red eye.  Contractions: Irregular. Vag. Bleeding: None.  Movement: Present. Denies leaking of fluid.   The following portions of the patient's history were reviewed and updated as appropriate: allergies, current medications, past family history, past medical history, past social history, past surgical history and problem list. Problem list updated.  Objective:   Vitals:   08/28/17 1428  BP: 119/64  Pulse: 83  Weight: 213 lb 12.8 oz (97 kg)    Fetal Status: Fetal Heart Rate (bpm): 145 Fundal Height: 35 cm Movement: Present  Presentation: Vertex  General:  Alert, oriented and cooperative. Patient is in no acute distress.  Skin: Skin is warm and dry. No rash noted.   Cardiovascular: Normal heart rate noted  Respiratory: Normal respiratory effort, no problems with respiration noted  Abdomen: Soft, gravid, appropriate for gestational age.  Pain/Pressure: Present     Pelvic: Cervical exam performed Dilation: 1 Effacement (%): 70 Station: -2  Extremities: Normal range of motion.  Edema: Trace  Mental Status:  Normal mood and affect. Normal behavior. Normal judgment and thought content.   Assessment and Plan:  Pregnancy: G1P0 at 3884w6d  1. Supervision of high risk pregnancy, antepartum GBS + already - GC[facility]Chlamydia probe amp [facility] - Flu Vaccine QUAD 36+ mos IM  2. GBS bacteriuria Will need treatment in labor  3. Marijuana use   4. Polyhydramnios in third  trimester, antepartum complication Growth and BPP 8/10 in MFM today - GC[facility]Chlamydia probe amp [facility]  5. Depression affecting pregnancy   6. Acute conjunctivitis of right eye, unspecified acute conjunctivitis type Notes red eye for several days - gentamicin (GARAMYCIN) 0.3 % ophthalmic solution; Place 1 drop every 4 (four) hours into the right eye.  Dispense: 5 mL; Refill: 0  Term labor symptoms and general obstetric precautions including but not limited to vaginal bleeding, contractions, leaking of fluid and fetal movement were reviewed in detail with the patient. Please refer to After Visit Summary for other counseling recommendations.  Return in 1 week (on 09/04/2017).   Reva Boresanya S Pratt, MD

## 2017-08-29 LAB — GC/CHLAMYDIA PROBE AMP (~~LOC~~) NOT AT ARMC
Chlamydia: NEGATIVE
NEISSERIA GONORRHEA: NEGATIVE

## 2017-09-08 ENCOUNTER — Encounter: Payer: Self-pay | Admitting: Advanced Practice Midwife

## 2017-09-08 ENCOUNTER — Inpatient Hospital Stay (HOSPITAL_COMMUNITY)
Admission: AD | Admit: 2017-09-08 | Discharge: 2017-09-11 | DRG: 806 | Disposition: A | Discharge status: Home / Self Care | Payer: Medicaid Other | Source: Ambulatory Visit | Attending: Family Medicine | Admitting: Family Medicine

## 2017-09-08 ENCOUNTER — Encounter (HOSPITAL_COMMUNITY): Payer: Self-pay

## 2017-09-08 ENCOUNTER — Other Ambulatory Visit: Payer: Self-pay

## 2017-09-08 DIAGNOSIS — E669 Obesity, unspecified: Secondary | ICD-10-CM | POA: Diagnosis present

## 2017-09-08 DIAGNOSIS — Z30017 Encounter for initial prescription of implantable subdermal contraceptive: Secondary | ICD-10-CM | POA: Diagnosis not present

## 2017-09-08 DIAGNOSIS — O99214 Obesity complicating childbirth: Secondary | ICD-10-CM | POA: Diagnosis present

## 2017-09-08 DIAGNOSIS — O403XX0 Polyhydramnios, third trimester, not applicable or unspecified: Secondary | ICD-10-CM | POA: Diagnosis present

## 2017-09-08 DIAGNOSIS — O99344 Other mental disorders complicating childbirth: Secondary | ICD-10-CM | POA: Diagnosis present

## 2017-09-08 DIAGNOSIS — Z3A39 39 weeks gestation of pregnancy: Secondary | ICD-10-CM

## 2017-09-08 DIAGNOSIS — O99324 Drug use complicating childbirth: Secondary | ICD-10-CM | POA: Diagnosis present

## 2017-09-08 DIAGNOSIS — O409XX0 Polyhydramnios, unspecified trimester, not applicable or unspecified: Secondary | ICD-10-CM | POA: Diagnosis present

## 2017-09-08 DIAGNOSIS — O99824 Streptococcus B carrier state complicating childbirth: Secondary | ICD-10-CM | POA: Diagnosis present

## 2017-09-08 DIAGNOSIS — F1290 Cannabis use, unspecified, uncomplicated: Secondary | ICD-10-CM | POA: Diagnosis present

## 2017-09-08 DIAGNOSIS — F329 Major depressive disorder, single episode, unspecified: Secondary | ICD-10-CM | POA: Diagnosis present

## 2017-09-08 LAB — TYPE AND SCREEN
ABO/RH(D): B POS
Antibody Screen: NEGATIVE

## 2017-09-08 LAB — CBC
HCT: 39 % (ref 36.0–46.0)
HEMOGLOBIN: 13.1 g/dL (ref 12.0–15.0)
MCH: 28.4 pg (ref 26.0–34.0)
MCHC: 33.6 g/dL (ref 30.0–36.0)
MCV: 84.6 fL (ref 78.0–100.0)
Platelets: 273 10*3/uL (ref 150–400)
RBC: 4.61 MIL/uL (ref 3.87–5.11)
RDW: 14.7 % (ref 11.5–15.5)
WBC: 3.7 10*3/uL — ABNORMAL LOW (ref 4.0–10.5)

## 2017-09-08 MED ORDER — LACTATED RINGERS IV SOLN
INTRAVENOUS | Status: DC
  Administered 2017-09-08 – 2017-09-09 (×2): via INTRAVENOUS

## 2017-09-08 NOTE — MAU Note (Signed)
Pt presents to MAU with c/o of ctx's that started last night. Pt denies vaginal bleeding and LOF. +FM

## 2017-09-09 ENCOUNTER — Inpatient Hospital Stay (HOSPITAL_COMMUNITY): Payer: Medicaid Other | Admitting: Anesthesiology

## 2017-09-09 ENCOUNTER — Encounter (HOSPITAL_COMMUNITY): Payer: Self-pay | Admitting: *Deleted

## 2017-09-09 ENCOUNTER — Encounter (HOSPITAL_COMMUNITY): Payer: Self-pay | Admitting: Anesthesiology

## 2017-09-09 DIAGNOSIS — Z3A39 39 weeks gestation of pregnancy: Secondary | ICD-10-CM

## 2017-09-09 DIAGNOSIS — O403XX0 Polyhydramnios, third trimester, not applicable or unspecified: Secondary | ICD-10-CM

## 2017-09-09 DIAGNOSIS — O99824 Streptococcus B carrier state complicating childbirth: Secondary | ICD-10-CM

## 2017-09-09 LAB — RAPID URINE DRUG SCREEN, HOSP PERFORMED
Amphetamines: NOT DETECTED
BENZODIAZEPINES: NOT DETECTED
Barbiturates: NOT DETECTED
Cocaine: NOT DETECTED
OPIATES: NOT DETECTED
Tetrahydrocannabinol: POSITIVE — AB

## 2017-09-09 LAB — RPR: RPR: NONREACTIVE

## 2017-09-09 LAB — ABO/RH: ABO/RH(D): B POS

## 2017-09-09 MED ORDER — PRENATAL MULTIVITAMIN CH
1.0000 | ORAL_TABLET | Freq: Every day | ORAL | Status: DC
  Administered 2017-09-10: 1 via ORAL
  Filled 2017-09-09: qty 1

## 2017-09-09 MED ORDER — SENNOSIDES-DOCUSATE SODIUM 8.6-50 MG PO TABS
2.0000 | ORAL_TABLET | ORAL | Status: DC
  Administered 2017-09-09 – 2017-09-11 (×2): 2 via ORAL
  Filled 2017-09-09 (×2): qty 2

## 2017-09-09 MED ORDER — IBUPROFEN 600 MG PO TABS
600.0000 mg | ORAL_TABLET | Freq: Four times a day (QID) | ORAL | Status: DC
  Administered 2017-09-09 – 2017-09-11 (×7): 600 mg via ORAL
  Filled 2017-09-09 (×8): qty 1

## 2017-09-09 MED ORDER — ONDANSETRON HCL 4 MG/2ML IJ SOLN: 4.0000 mg | Freq: Four times a day (QID) | INTRAMUSCULAR | Status: DC | PRN

## 2017-09-09 MED ORDER — COCONUT OIL OIL: 1.0000 "application " | TOPICAL_OIL | Status: DC | PRN

## 2017-09-09 MED ORDER — LACTATED RINGERS IV SOLN: 500.0000 mL | INTRAVENOUS | Status: DC | PRN

## 2017-09-09 MED ORDER — LACTATED RINGERS IV SOLN: 500.0000 mL | Freq: Once | INTRAVENOUS | Status: DC

## 2017-09-09 MED ORDER — OXYCODONE-ACETAMINOPHEN 5-325 MG PO TABS: 2.0000 | ORAL_TABLET | ORAL | Status: DC | PRN

## 2017-09-09 MED ORDER — ACETAMINOPHEN 325 MG PO TABS: 650.0000 mg | ORAL_TABLET | ORAL | Status: DC | PRN

## 2017-09-09 MED ORDER — EPHEDRINE 5 MG/ML INJ
10.0000 mg | INTRAVENOUS | Status: DC | PRN
  Filled 2017-09-09: qty 2

## 2017-09-09 MED ORDER — ZOLPIDEM TARTRATE 5 MG PO TABS: 5.0000 mg | ORAL_TABLET | Freq: Every evening | ORAL | Status: DC | PRN

## 2017-09-09 MED ORDER — ONDANSETRON HCL 4 MG/2ML IJ SOLN: 4.0000 mg | INTRAMUSCULAR | Status: DC | PRN

## 2017-09-09 MED ORDER — PENICILLIN G POTASSIUM 5000000 UNITS IJ SOLR
5.0000 10*6.[IU] | Freq: Once | INTRAVENOUS | Status: AC
  Administered 2017-09-09: 5 10*6.[IU] via INTRAVENOUS
  Filled 2017-09-09: qty 5

## 2017-09-09 MED ORDER — SIMETHICONE 80 MG PO CHEW: 80.0000 mg | CHEWABLE_TABLET | ORAL | Status: DC | PRN

## 2017-09-09 MED ORDER — TERBUTALINE SULFATE 1 MG/ML IJ SOLN
0.2500 mg | Freq: Once | INTRAMUSCULAR | Status: DC | PRN
  Filled 2017-09-09: qty 1

## 2017-09-09 MED ORDER — FENTANYL 2.5 MCG/ML BUPIVACAINE 1/10 % EPIDURAL INFUSION (WH - ANES)
14.0000 mL/h | INTRAMUSCULAR | Status: DC | PRN
  Administered 2017-09-09: 14 mL/h via EPIDURAL
  Filled 2017-09-09: qty 100

## 2017-09-09 MED ORDER — BENZOCAINE-MENTHOL 20-0.5 % EX AERO
1.0000 "application " | INHALATION_SPRAY | CUTANEOUS | Status: DC | PRN
  Administered 2017-09-09: 1 via TOPICAL
  Filled 2017-09-09: qty 56

## 2017-09-09 MED ORDER — LIDOCAINE HCL (PF) 1 % IJ SOLN
INTRAMUSCULAR | Status: DC | PRN
  Administered 2017-09-09: 4 mL via EPIDURAL
  Administered 2017-09-09: 8 mL via EPIDURAL

## 2017-09-09 MED ORDER — MISOPROSTOL 200 MCG PO TABS
50.0000 ug | ORAL_TABLET | ORAL | Status: DC | PRN
  Administered 2017-09-09 (×2): 50 ug via BUCCAL
  Filled 2017-09-09 (×2): qty 1

## 2017-09-09 MED ORDER — OXYTOCIN BOLUS FROM INFUSION
500.0000 mL | Freq: Once | INTRAVENOUS | Status: AC
  Administered 2017-09-09: 500 mL via INTRAVENOUS

## 2017-09-09 MED ORDER — ONDANSETRON HCL 4 MG PO TABS: 4.0000 mg | ORAL_TABLET | ORAL | Status: DC | PRN

## 2017-09-09 MED ORDER — SOD CITRATE-CITRIC ACID 500-334 MG/5ML PO SOLN: 30.0000 mL | ORAL | Status: DC | PRN

## 2017-09-09 MED ORDER — OXYTOCIN 40 UNITS IN LACTATED RINGERS INFUSION - SIMPLE MED
1.0000 m[IU]/min | INTRAVENOUS | Status: DC
  Administered 2017-09-09: 2 m[IU]/min via INTRAVENOUS
  Filled 2017-09-09: qty 1000

## 2017-09-09 MED ORDER — FLEET ENEMA 7-19 GM/118ML RE ENEM: 1.0000 | ENEMA | RECTAL | Status: DC | PRN

## 2017-09-09 MED ORDER — PHENYLEPHRINE 40 MCG/ML (10ML) SYRINGE FOR IV PUSH (FOR BLOOD PRESSURE SUPPORT)
80.0000 ug | PREFILLED_SYRINGE | INTRAVENOUS | Status: DC | PRN
  Filled 2017-09-09: qty 10
  Filled 2017-09-09: qty 5

## 2017-09-09 MED ORDER — PENICILLIN G POT IN DEXTROSE 60000 UNIT/ML IV SOLN
3.0000 10*6.[IU] | INTRAVENOUS | Status: DC
  Administered 2017-09-09 (×3): 3 10*6.[IU] via INTRAVENOUS
  Filled 2017-09-09 (×6): qty 50

## 2017-09-09 MED ORDER — TETANUS-DIPHTH-ACELL PERTUSSIS 5-2.5-18.5 LF-MCG/0.5 IM SUSP: 0.5000 mL | Freq: Once | INTRAMUSCULAR | Status: DC

## 2017-09-09 MED ORDER — PHENYLEPHRINE 40 MCG/ML (10ML) SYRINGE FOR IV PUSH (FOR BLOOD PRESSURE SUPPORT)
80.0000 ug | PREFILLED_SYRINGE | INTRAVENOUS | Status: DC | PRN
  Filled 2017-09-09: qty 5
  Filled 2017-09-09: qty 10

## 2017-09-09 MED ORDER — DIPHENHYDRAMINE HCL 25 MG PO CAPS: 25.0000 mg | ORAL_CAPSULE | Freq: Four times a day (QID) | ORAL | Status: DC | PRN

## 2017-09-09 MED ORDER — WITCH HAZEL-GLYCERIN EX PADS: 1.0000 "application " | MEDICATED_PAD | CUTANEOUS | Status: DC | PRN

## 2017-09-09 MED ORDER — DIPHENHYDRAMINE HCL 50 MG/ML IJ SOLN: 12.5000 mg | INTRAMUSCULAR | Status: DC | PRN

## 2017-09-09 MED ORDER — LIDOCAINE HCL (PF) 1 % IJ SOLN
30.0000 mL | INTRAMUSCULAR | Status: AC | PRN
  Administered 2017-09-09: 30 mL via SUBCUTANEOUS
  Filled 2017-09-09: qty 30

## 2017-09-09 MED ORDER — OXYTOCIN 40 UNITS IN LACTATED RINGERS INFUSION - SIMPLE MED: 2.5000 [IU]/h | INTRAVENOUS | Status: DC

## 2017-09-09 MED ORDER — OXYCODONE-ACETAMINOPHEN 5-325 MG PO TABS: 1.0000 | ORAL_TABLET | ORAL | Status: DC | PRN

## 2017-09-09 MED ORDER — DIBUCAINE 1 % RE OINT: 1.0000 "application " | TOPICAL_OINTMENT | RECTAL | Status: DC | PRN

## 2017-09-09 MED ORDER — FENTANYL CITRATE (PF) 100 MCG/2ML IJ SOLN
100.0000 ug | INTRAMUSCULAR | Status: DC | PRN
  Administered 2017-09-09: 100 ug via INTRAVENOUS
  Filled 2017-09-09: qty 2

## 2017-09-09 NOTE — Anesthesia Preprocedure Evaluation (Addendum)
Anesthesia Evaluation  Patient identified by MRN, date of birth, ID band Patient awake    Reviewed: Allergy & Precautions, NPO status , Patient's Chart, lab work & pertinent test results  Airway Mallampati: III  TM Distance: >3 FB Neck ROM: Full    Dental  (+) Dental Advisory Given, Teeth Intact   Pulmonary neg pulmonary ROS,    Pulmonary exam normal breath sounds clear to auscultation       Cardiovascular negative cardio ROS Normal cardiovascular exam Rhythm:Regular Rate:Normal     Neuro/Psych  Headaches, Depression    GI/Hepatic negative GI ROS, Neg liver ROS,   Endo/Other  Obesity  Renal/GU negative Renal ROS  negative genitourinary   Musculoskeletal negative musculoskeletal ROS (+)   Abdominal   Peds  Hematology negative hematology ROS (+)   Anesthesia Other Findings   Reproductive/Obstetrics (+) Pregnancy                            Anesthesia Physical Anesthesia Plan  ASA: II  Anesthesia Plan: Epidural   Post-op Pain Management:    Induction:   PONV Risk Score and Plan:   Airway Management Planned: Natural Airway  Additional Equipment:   Intra-op Plan:   Post-operative Plan:   Informed Consent: I have reviewed the patients History and Physical, chart, labs and discussed the procedure including the risks, benefits and alternatives for the proposed anesthesia with the patient or authorized representative who has indicated his/her understanding and acceptance.     Plan Discussed with:   Anesthesia Plan Comments: (Labs reviewed. Platelets acceptable, patient not taking any blood thinning medications. Risks and benefits discussed with patient, patient expressed understanding and wished to proceed.)        Anesthesia Quick Evaluation

## 2017-09-09 NOTE — Progress Notes (Signed)
CSW acknowledged consult and will meet with patient after L&D.  Arly Salminen Boyd-Gilyard, MSW, LCSW Clinical Social Work [phone]  

## 2017-09-09 NOTE — Anesthesia Postprocedure Evaluation (Signed)
Anesthesia Post Note  Patient: Casey Manning  Procedure(s) Performed: AN AD HOC LABOR EPIDURAL     Patient location during evaluation: Mother Baby Anesthesia Type: Epidural Level of consciousness: awake and alert and oriented Pain management: satisfactory to patient Vital Signs Assessment: post-procedure vital signs reviewed and stable Respiratory status: respiratory function stable Cardiovascular status: stable Postop Assessment: no headache, no backache, epidural receding, patient able to bend at knees, no signs of nausea or vomiting and adequate PO intake Anesthetic complications: no    Last Vitals:  Vitals:   09/09/17 1530 09/09/17 1605  BP: (!) 110/55 (!) 103/55  Pulse: 73 65  Resp: 18 18  Temp:  37.3 C    Last Pain:  Vitals:   09/09/17 1605  TempSrc: Oral  PainSc: 0-No pain   Pain Goal:                 Casey Manning

## 2017-09-09 NOTE — Progress Notes (Signed)
LABOR PROGRESS NOTE  Casey Manning is a 21 y.o. G1P0000 at 7166w4d  admitted for IOL for polyhydramnios.   Subjective: Patient doing well with complaints of increased pain for which she is requesting IV pain meds.   Objective: BP (!) 129/91   Pulse 86   Temp 98.2 F (36.8 C) (Oral)   Resp 16   Ht 5\' 3"  (1.6 m)   Wt 90.7 kg (200 lb)   LMP 12/01/2016   BMI 35.43 kg/m  or  Vitals:   09/09/17 0102 09/09/17 0552 09/09/17 0702 09/09/17 0742  BP:  97/61 132/77 (!) 129/91  Pulse:  74 85 86  Resp:  16  16  Temp:  97.9 F (36.6 C)  98.2 F (36.8 C)  TempSrc:  Oral  Oral  Weight: 90.7 kg (200 lb)     Height: 5\' 3"  (1.6 m)      HFT: HR 130, moderate variability, + accels, negative decels  Dilation: 2.5 Effacement (%): 90 Cervical Position: Posterior Station: -2 Presentation: Vertex Exam by:: Dr Doroteo GlassmanPhelps  Labs: Lab Results  Component Value Date   WBC 3.7 (L) 09/08/2017   HGB 13.1 09/08/2017   HCT 39.0 09/08/2017   MCV 84.6 09/08/2017   PLT 273 09/08/2017    Patient Active Problem List   Diagnosis Date Noted  . Polyhydramnios 09/08/2017  . Supervision of high risk pregnancy, antepartum 07/31/2017  . GBS bacteriuria 07/31/2017  . Marijuana use 07/31/2017  . Polyhydramnios in third trimester, antepartum complication 07/31/2017  . Depression affecting pregnancy 07/31/2017  . Migraine without aura and without status migrainosus, not intractable 06/13/2016    Assessment / Plan: 21 y.o. G1P0000 at 1866w4d here for IOL for polyhydramnios.   Labor: Expectant management. FB placed.  Fetal Wellbeing:  Cat 1 Pain Control:  IV pain meds Anticipated MOD:  SVD  Felisa BonierBianca Audry Pecina, MD 09/09/2017, 7:58 AM

## 2017-09-09 NOTE — Anesthesia Procedure Notes (Addendum)
Epidural Patient location during procedure: OB Start time: 09/09/2017 9:59 AM End time: 09/09/2017 10:04 AM  Staffing Anesthesiologist: Beryle LatheBrock, Muhanad Torosyan E, MD Performed: anesthesiologist   Preanesthetic Checklist Completed: patient identified, pre-op evaluation, timeout performed, IV checked, risks and benefits discussed and monitors and equipment checked  Epidural Patient position: sitting Prep: DuraPrep Patient monitoring: continuous pulse ox and blood pressure Approach: midline Location: L3-L4 Injection technique: LOR saline  Needle:  Needle type: Tuohy  Needle gauge: 17 G Needle length: 15 cm Needle insertion depth: 11 cm Catheter size: 19 Gauge Catheter at skin depth: 17 cm Test dose: negative and Other (1% lidocaine)  Additional Notes Patient identified. Risks including, but not limited to, bleeding, infection, nerve damage, paralysis, inadequate analgesia, blood pressure changes, nausea, vomiting, allergic reaction, postpartum back pain, itching, and headache were discussed. Patient expressed understanding and wished to proceed. Sterile prep and drape, including hand hygiene, mask, and sterile gloves were used. The patient was positioned and the spine was prepped. The skin was anesthetized with lidocaine. No paraesthesia or other complication noted. The patient did not experience any signs of intravascular injection such as tinnitus or metallic taste in mouth, nor signs of intrathecal spread such as rapid motor block. Please see nursing notes for vital signs. The patient tolerated the procedure well.   Leslye Peerhomas Drue Harr, MDReason for block:procedure for pain

## 2017-09-09 NOTE — Progress Notes (Signed)
LABOR PROGRESS NOTE  Doree AlbeeMaria Villatoro-Medrano is a 21 y.o. G1P0000 at 5663w4d  admitted for IOL for polyhydramnios.  Subjective: Patient is uncomfortable with contractions- request epidural at this time.   Objective: BP (!) 129/91   Pulse 86   Temp 98.2 F (36.8 C) (Oral)   Resp 16   Ht 5\' 3"  (1.6 m)   Wt 200 lb (90.7 kg)   LMP 12/01/2016   BMI 35.43 kg/m  or  Vitals:   09/09/17 0102 09/09/17 0552 09/09/17 0702 09/09/17 0742  BP:  97/61 132/77 (!) 129/91  Pulse:  74 85 86  Resp:  16  16  Temp:  97.9 F (36.6 C)  98.2 F (36.8 C)  TempSrc:  Oral  Oral  Weight: 200 lb (90.7 kg)     Height: 5\' 3"  (1.6 m)       Dilation: 2.5 Effacement (%): 90 Cervical Position: Posterior Station: -2 Presentation: Vertex Exam by:: Dr Doroteo GlassmanPhelps FHT: baseline rate 130, moderate varibility, + acel, no decel Toco: 6 minutes, mild/moderate by palpation   Labs: Lab Results  Component Value Date   WBC 3.7 (L) 09/08/2017   HGB 13.1 09/08/2017   HCT 39.0 09/08/2017   MCV 84.6 09/08/2017   PLT 273 09/08/2017    Patient Active Problem List   Diagnosis Date Noted  . Polyhydramnios 09/08/2017  . Supervision of high risk pregnancy, antepartum 07/31/2017  . GBS bacteriuria 07/31/2017  . Marijuana use 07/31/2017  . Polyhydramnios in third trimester, antepartum complication 07/31/2017  . Depression affecting pregnancy 07/31/2017  . Migraine without aura and without status migrainosus, not intractable 06/13/2016    Assessment / Plan: 21 y.o. G1P0000 at 3563w4d here for IOL for polyhydramnios   Labor: Progressing well. Pitocin to be started at 1000 after 4hr from cytotec  Fetal Wellbeing:  Cat 1 Pain Control:  Epidural requested  Anticipated MOD:  SVD  Sharyon CableRogers, Indi Willhite C, CNM 09/09/2017, 9:19 AM

## 2017-09-09 NOTE — H&P (Signed)
LABOR AND DELIVERY ADMISSION HISTORY AND PHYSICAL NOTE  Casey Manning is a 21 y.o. female G1P0 with IUP at 2465w4d by ultrasound presenting with x1 day of contractions with history of polyhydraminos. Patient has not been to any prenatal visits since 37wks. US on 11/15 by MFM recommended induction for polyhydramnios at 39wk.  She reports positive fetal movement and irregular contractions. She denies leakage of fluid or vaginal bleeding.   Prenatal History/Complications: History of polyhydramnios, marijuana use, and depression.  GBS positive  Past Medical History: Past Medical History:  Diagnosis Date  . Seasonal allergies     Past Surgical History: Past Surgical History:  Procedure Laterality Date  . NO PAST SURGERIES      Obstetrical History: OB History    Gravida Para Term Preterm AB Living   1             SAB TAB Ectopic Multiple Live Births                  Social History: Social History   Socioeconomic History  . Marital status: Single    Spouse name: None  . Number of children: None  . Years of education: None  . Highest education level: None  Social Needs  . Financial resource strain: None  . Food insecurity - worry: None  . Food insecurity - inability: None  . Transportation needs - medical: None  . Transportation needs - non-medical: None  Occupational History  . None  Tobacco Use  . Smoking status: Never Smoker  . Smokeless tobacco: Never Used  Substance and Sexual Activity  . Alcohol use: No  . Drug use: Yes    Types: Marijuana  . Sexual activity: Not Currently    Birth control/protection: Condom  Other Topics Concern  . None  Social History Narrative   ** Merged History Encounter **        Family History: History reviewed. No pertinent family history.  Allergies: No Known Allergies  Medications Prior to Admission  Medication Sig Dispense Refill Last Dose  . gentamicin (GARAMYCIN) 0.3 % ophthalmic solution Place 1 drop every 4  (four) hours into the right eye. 5 mL 0 Past Month at Unknown time  . Prenatal Vit-Fe Fumarate-FA (PRENATAL 1 PLUS 1 PO) Take 1 tablet by mouth daily.   Past Week at Unknown time     Review of Systems   All systems reviewed and negative except as stated in HPI  Blood pressure 116/66, pulse 81, temperature 98.3 F (36.8 C), temperature source Oral, resp. rate 18, last menstrual period 12/01/2016. General appearance: alert, cooperative and no distress Lungs: clear to auscultation bilaterally Heart: regular rate and rhythm Abdomen: soft, non-tender; bowel sounds normal Extremities: No calf swelling or tenderness Presentation: cephalic Fetal monitoring: HR 782150, moderate variability, positive accel, negative decels Uterine activity: q 6-7 minutes Dilation: 1 Effacement (%): 60 Station: -3 Exam by:: Waymon BudgeBrittany Bowen, RN    Prenatal labs: ABO, Rh: --/--/B POS (11/26 2130) Antibody: NEG (11/26 2130) Rubella: Immune (06/18 0000) RPR: Nonreactive (10/05 0000)  HBsAg: Negative (06/18 0000)  HIV: Non-reactive (06/18 0000)  GBS:   positive 1 hr Glucola: 104; third trimester: abnormal 1 hr, normal 3 hr Genetic screening:  First screen too late; Quad negative Anatomy US: normal  Prenatal Transfer Tool  Maternal Diabetes: No Genetic Screening: Normal  Maternal Ultrasounds/Referrals: Normal Fetal Ultrasounds or other Referrals:  None Maternal Substance Abuse:  Yes:  Type: Marijuana Significant Maternal Medications:  None Significant Maternal Lab  Results: Lab values include: Group B Strep positive  Results for orders placed or performed during the hospital encounter of 09/08/17 (from the past 24 hour(s))  CBC   Collection Time: 09/08/17  9:30 PM  Result Value Ref Range   WBC 3.7 (L) 4.0 - 10.5 K/uL   RBC 4.61 3.87 - 5.11 MIL/uL   Hemoglobin 13.1 12.0 - 15.0 g/dL   HCT 78.239.0 95.636.0 - 21.346.0 %   MCV 84.6 78.0 - 100.0 fL   MCH 28.4 26.0 - 34.0 pg   MCHC 33.6 30.0 - 36.0 g/dL   RDW 08.614.7  57.811.5 - 46.915.5 %   Platelets 273 150 - 400 K/uL  Type and screen [facility]   Collection Time: 09/08/17  9:30 PM  Result Value Ref Range   ABO/RH(D) B POS    Antibody Screen NEG    Sample Expiration 09/11/2017     Patient Active Problem List   Diagnosis Date Noted  . Polyhydramnios 09/08/2017  . Supervision of high risk pregnancy, antepartum 07/31/2017  . GBS bacteriuria 07/31/2017  . Marijuana use 07/31/2017  . Polyhydramnios in third trimester, antepartum complication 07/31/2017  . Depression affecting pregnancy 07/31/2017  . Migraine without aura and without status migrainosus, not intractable 06/13/2016    Assessment: Casey Manning is a 21 y.o. G1P0 at 442w4d here for IOL for polyhydramnios.    #Labor: Admit to birthing suites. Induction with Cytotec to be initiated. Will place FB after first dose.  #Pain: IV pain meds as needed. Undecided about epidural, will consider depending on level of pain as labor progresses.  #FWB:  Cat 1 #ID: GBS pos - PCN #MOF: breast #MOC: nexplanon #Circ:  No  CSW consult PP due to substance use and depression. Collect UDS  Felisa BonierBianca Seivley 09/09/2017, 12:01 AM PA-S  OB FELLOW HISTORY AND PHYSICAL ATTESTATION  I confirm that I have verified the information documented in the physician assistant student's note and that I have also personally reperformed the physical exam and all medical decision making activities. I agree with above documentation and have made edits as needed.   Caryl AdaJazma Anahlia Iseminger, DO OB Fellow 09/09/2017, 8:46 AM

## 2017-09-10 MED ORDER — LIDOCAINE HCL 1 % IJ SOLN
0.0000 mL | Freq: Once | INTRAMUSCULAR | Status: AC | PRN
  Administered 2017-09-10: 20 mL via INTRADERMAL
  Filled 2017-09-10: qty 20

## 2017-09-10 MED ORDER — ETONOGESTREL 68 MG ~~LOC~~ IMPL
68.0000 mg | DRUG_IMPLANT | Freq: Once | SUBCUTANEOUS | Status: AC
  Administered 2017-09-10: 68 mg via SUBCUTANEOUS
  Filled 2017-09-10: qty 1

## 2017-09-10 NOTE — Procedures (Signed)
PROCEDURE NOTE: Nexplanon Insertion  Patient given informed consent, signed copy in the chart, time out was performed.  Patient is PPD#1  Patient's right arm was prepped and draped in the usual sterile fashion. The ruler used to measure and mark insertion area. Pt was prepped with alcohol swab and then injected with 3 cc of 1% lidocaine with epinephrine used to anesthetize the area.  Pt was prepped with betadine, nexplanon removed from packaging, device confirmed in needle, then inserted full length of needle and withdrawn per manufacturer's instructions. Minimal blood loss. The insertion site covered with steri-strips and a pressure bandage to minimize bruising. There were no complications and the patient tolerated the procedure well.  Device information was given in handout form. Patient is informed the removal date will be in three years and package insert card filled out and given to her.  Caryl AdaJazma Phelps, DO

## 2017-09-10 NOTE — Progress Notes (Signed)
POSTPARTUM PROGRESS NOTE  Post Partum Day 1 Subjective:  Casey Manning is a 21 y.o. G1P1001 8448w4d s/p SVD following IOL for polyhydramnios. No acute events overnight.  Pt denies problems with voiding or po intake. Patient states she has only ambulated to the bathroom a few times, but has no difficulties doing so. She denies nausea or vomiting.  Pain is well controlled with Motrin.  She has not had flatus. She has not had bowel movement.  Lochia Moderate.   Objective: Blood pressure 115/60, pulse 62, temperature 98.1 F (36.7 C), temperature source Oral, resp. rate 18, height 5\' 3"  (1.6 m), weight 90.7 kg (200 lb), last menstrual period 12/01/2016, unknown if currently breastfeeding.  Physical Exam:  General: alert, cooperative and no distress Lochia:normal flow Chest: no respiratory distress Heart:regular rate, distal pulses intact Abdomen: soft, nontender Uterine Fundus: firm, appropriately tender DVT Evaluation: No calf swelling or tenderness Extremities: No edema  Recent Labs    09/08/17 2130  HGB 13.1  HCT 39.0    Assessment/Plan:  ASSESSMENT: Casey Manning is a 21 y.o. G1P1001 8148w4d s/p SVD following IOL for polyhydramnios.   Patient encouraged to continue ambulating as much as tolerated and to keep trying feeds q3 hours even if baby not latching/eating for short periods of time.    Plan for discharge tomorrow, Breastfeeding and Contraception nexplanon prior to discharge.    LOS: 2 days   Bianca SeivleyMD 09/10/2017, 6:20 AM    OB FELLOW MEDICAL STUDENT NOTE ATTESTATION  I confirm that I have verified the information documented in the medical student's note and that I have also personally performed the physical exam and all medical decision making activities.  PPD#1, doing well.  Will get Nexplanon inpatient  Breastfeeding D/c home tomorrow  Frederik PearJulie P Degele, MD OB Fellow 09/10/2017, 8:48 AM

## 2017-09-10 NOTE — Lactation Note (Signed)
This note was copied from a baby's chart. Lactation Consultation Note  Patient Name: Boy Doree AlbeeMaria Villatoro-Medrano [id number]'s Date: 09/10/2017 Reason for consult: Initial assessment  P1, Baby 28 hours old.  Reviewed hand expression. Mother concerned about volume.  Reassured mother and provided education. Positioned baby in football hold and baby sucked for 5 min and fell asleep. Attempted also in cross cradle but baby too sleepy. Discussed spoon feeding. Mom encouraged to feed baby 8-12 times/24 hours and with feeding cues.  Reviewed basics.  Mother will need more teaching. Mom made aware of O/P services, breastfeeding support groups, community resources, and our phone # for post-discharge questions.    Maternal Data Has patient been taught Hand Expression?: Yes Does the patient have breastfeeding experience prior to this delivery?: No  Feeding Feeding Type: Breast Fed Length of feed: 5 min  LATCH Score Latch: Repeated attempts needed to sustain latch, nipple held in mouth throughout feeding, stimulation needed to elicit sucking reflex.  Audible Swallowing: A few with stimulation  Type of Nipple: Flat  Comfort (Breast/Nipple): Soft / non-tender  Hold (Positioning): Assistance needed to correctly position infant at breast and maintain latch.  LATCH Score: 6  Interventions Interventions: Breast feeding basics reviewed;Assisted with latch;Skin to skin;Breast massage;Pre-pump if needed;Hand express  Lactation Tools Discussed/Used     Consult Status Consult Status: Follow-up Date: 09/11/17 Follow-up type: In-patient    Dahlia ByesBerkelhammer, Nuria Phebus [facility] 09/10/2017, 6:33 PM

## 2017-09-10 NOTE — Clinical Social Work Maternal (Signed)
CLINICAL SOCIAL WORK MATERNAL/CHILD NOTE  Patient Details  Name: Casey Manning MRN: [id number] Date of Birth: [birth year]  Date:  09/10/2017  Clinical Social Worker Initiating Note:  Casey Manning Date/Time: Initiated:  09/10/17/1318     Child's Name:  Casey Manning   Biological Parents:  Mother, Father(FOB is Casey Manning 10/16/1996 [phone])   Need for Interpreter:  None   Reason for Referral:  Behavioral Health Concerns, Current Substance Use/Substance Use During Pregnancy    Address:  [place] [address]    Phone number:  [phone] (home)     Additional phone number:   Household Members/Support Persons (HM/SP):   Household Member/Support Person 1(MOB resides with MOB's mother; Casey Manning)   HM/SP Name Relationship DOB or Age  HM/SP -1        HM/SP -2        HM/SP -3        HM/SP -4        HM/SP -5        HM/SP -6        HM/SP -7        HM/SP -8          Natural Supports (not living in the home):  Friends, Spouse/significant other, Extended Family, Immediate Family(FOB's family will also provide supports. )   Professional Supports: None(CSW made a referral to the Casey Manning.)   Employment: Unemployed   Type of Work:     Education:  9 to 11 years   Homebound arranged: No  Financial Resources:  Medicaid   Other Resources:  WIC(MOB was given information to apply for Casey Manning and Medicaid.)   Cultural/Religious Considerations Which May Impact Care:  Per Casey Manning, MOB is [employer]. Strengths:  Ability to meet basic needs , Home prepared for child , Pediatrician chosen   Psychotropic Medications:         Pediatrician:    [place] Manning area  Pediatrician List:   Casey Manning for [facility]      Pediatrician Fax Number:    Risk Factors/Current Problems:  Mental  Health Concerns , Substance Use    Cognitive State:  Alert , Able to Concentrate , Linear Thinking    Mood/Affect:  Apprehensive , Calm , Comfortable , Relaxed    CSW Assessment: CSW met with MOB to complete an assessment for a consult for hx of THC use in pregnancy and hx of depression.  MOB was polite and was receptive with meeting with CSW.  When CSW arrived, MOB's mother was bonding with infant and MOB was resting in bed. MOB gave CSW permission to complete the assessment while MOB's mother was present. CSW inquired about MOB's substance use, and MOB denied the use of marijuana use during MOB's pregnancy.  MOB stated "I have not smoked anything since February but I be around people that smoke all the time."  MOB was adamant about not smoking during pregnancy although CSW shared MOB's positive UDS results.   CSW informed MOB of the hospital's drug screen policy. MOB was made aware of the 2 drug screenings for the infant.  MOB was understanding and did not have any concerns.  CSW shared with MOB that the infant had a negative UDS, and CSW will monitor the infant's CDS and will make a report to Casey Manning  if warranted. CSW offered MOB resources and referrals for substance interventions and MOB declined.  However, MOB was receptive to a referral to the Casey Manning. MOB did not have any questions about the hospital's policy.   CSW also asked about MOB's MH hx.  MOB denied having a dx for depression and stated, "I was going through something awhile ago."  MOB was not open to share what she was "going through."  CSW informed MOB that MOB can also received in-home counseling through the Casey Manning (CSW made the referral to Casey Manning, Casey Manning). CSW provided education regarding the baby blues period vs. perinatal mood disorders, discussed treatment and gave resources for mental health follow up if concerns arise.  CSW recommends self-evaluation  during the Casey time period using the Casey Manning from Casey Manning and encouraged MOB to contact a medical professional if symptoms are noted at any time.    CSW provided review of Sudden Infant Death Syndrome (SIDS) precautions.    CSW identifies no further need for intervention and no barriers to discharge at this time.  CSW Plan/Description:  No Further Intervention Required/No Barriers to Discharge, Sudden Infant Death Syndrome (SIDS) Education, Perinatal Mood and Anxiety Disorder (PMADs) Education, Other Patient/Family Education, Other Information/Referral to [employer], CSW Will Continue to Monitor Umbilical Cord Tissue Drug Screen Results and Make Report if Warranted   Casey Manning, MSW, LCSW Clinical Social Work [phone]  Casey Nanas, LCSW 09/10/2017, 1:34 PM

## 2017-09-10 NOTE — Progress Notes (Deleted)
Nexplanon placed by Dr. Doroteo GlassmanPhelps.    Consent was obtained and documented with explanation of risks/side effects.  Implantation was performed with hemostasis achieved via applied pressure and bandaged with pressure dressing.

## 2017-09-11 ENCOUNTER — Other Ambulatory Visit: Payer: Self-pay

## 2017-09-11 ENCOUNTER — Ambulatory Visit: Payer: Self-pay

## 2017-09-11 MED ORDER — IBUPROFEN 600 MG PO TABS: 600.0000 mg | ORAL_TABLET | Freq: Four times a day (QID) | ORAL | 0 refills | Status: DC

## 2017-09-11 NOTE — Discharge Summary (Signed)
OB Discharge Summary     Patient Name: Casey Manning DOB: 07/07/1996 MRN: [id number]  Date of admission: 09/08/2017 Delivering MD: Sharyon CableOGERS, VERONICA C   Date of discharge: 09/11/2017  Admitting diagnosis: 39.3WKS CTX IOL for polyhydramnios Intrauterine pregnancy: 2110w4d     Secondary diagnosis:  Active Problems:   Polyhydramnios   SVD (spontaneous vaginal delivery)  Additional problems: UDS +THC     Discharge diagnosis: Term Pregnancy Delivered                                                                                                Post partum procedures:Nexplanon placed in R arm 11/28  Augmentation: Cytotec and Foley Balloon  Complications: None  Hospital course:  Induction of Labor With Vaginal Delivery   21 y.o. yo G1P1001 at 41110w4d was admitted to the hospital 09/08/2017 for induction of labor. Indication for induction: polyhydramnios.  Patient had an uncomplicated labor course as follows: Membrane Rupture Time/Date: 9:52 AM ,09/09/2017   Intrapartum Procedures: Episiotomy: None [1]                                         Lacerations:  Labial [10]  Patient had delivery of a Viable infant.  Information for the patient's newborn:  Velora HecklerVillatoro-Medrano, Boy Grecia [[id number]][[id number]]  Delivery Method: Vag-Spont   09/09/2017  Details of delivery can be found in separate delivery note.  Patient had a routine postpartum course. Patient is discharged home 09/11/17.  Physical exam  Vitals:   09/09/17 1705 09/10/17 0645 09/10/17 1841 09/11/17 0549  BP: 115/60 (!) 102/57 113/60 114/77  Pulse: 62 74 71 60  Resp: 18 18 18 18   Temp: 98.1 F (36.7 C) 98.1 F (36.7 C) 98.5 F (36.9 C) 98.5 F (36.9 C)  TempSrc: Oral Oral Oral Oral  Weight:  201 lb 12.8 oz (91.5 kg)  202 lb 3.2 oz (91.7 kg)  Height:       General: alert, cooperative and no distress Lochia: appropriate Uterine Fundus: firm, below level of umbilicus, appropriately tender Incision: N/A DVT Evaluation:  No evidence of DVT seen on physical exam. Labs: Lab Results  Component Value Date   WBC 3.7 (L) 09/08/2017   HGB 13.1 09/08/2017   HCT 39.0 09/08/2017   MCV 84.6 09/08/2017   PLT 273 09/08/2017   CMP Latest Ref Rng & Units 01/12/2013  Glucose 70 - 99 mg/dL 87  BUN 6 - 23 mg/dL 8  Creatinine 7.820.47 - 9.561.00 mg/dL 2.13(Y0.44(L)  Sodium 865135 - 784145 mEq/L 138  Potassium 3.5 - 5.1 mEq/L 3.6  Chloride 96 - 112 mEq/L 101  CO2 19 - 32 mEq/L 25  Calcium 8.4 - 10.5 mg/dL 9.5  Total Protein 6.0 - 8.3 g/dL 7.5  Total Bilirubin 0.3 - 1.2 mg/dL 0.3  Alkaline Phos 50 - 162 U/L 111  AST 0 - 37 U/L 23  ALT 0 - 35 U/L 24    Discharge instruction: per After Visit Summary and "Baby and Me Booklet".  After visit  meds:  Allergies as of 09/11/2017   No Known Allergies     Medication List    TAKE these medications   gentamicin 0.3 % ophthalmic solution Commonly known as:  GARAMYCIN Place 1 drop every 4 (four) hours into the right eye.   ibuprofen 600 MG tablet Commonly known as:  ADVIL,MOTRIN Take 1 tablet (600 mg total) by mouth every 6 (six) hours.   PRENATAL 1 PLUS 1 PO Take 1 tablet by mouth daily.      Diet: routine diet  Activity: Advance as tolerated. Pelvic rest for 6 weeks.   Outpatient follow up:4 weeks Follow up Appt:No future appointments. Follow up Visit:No Follow-up on file.  Postpartum contraception: Nexplanon placed in R arm 11/28  Newborn Data: Live born female  Birth Weight: 6 lb 11.2 oz (3040 g) APGAR: 9, 9  Newborn Delivery   Birth date/time:  09/09/2017 14:09:00 Delivery type:  Vaginal, Spontaneous     Baby Feeding: Bottle and Breast Disposition:home with mother  09/11/2017 Kandra NicolasJulie P. Degele, MD

## 2017-09-11 NOTE — Lactation Note (Signed)
This note was copied from a baby's chart. Lactation Consultation Note;  Mother has been using a #20 nipple shield. Infant is at 8% weight loss. Mother has DEBP sat up and reports that she is not getting anything. Mother has a hand pump at the bedside. She reports that she has not been able to pump anything with hand pump. Mother was taught hand expression . Observed drops of colostrum. Mother advised to firm nipple and reviewed off sided latch. Infant has been bottle feeding as well as breastfeeding. Lots of discussion about mothers goals for breastfeeding.  Assist mother with latching infant in cross cradle hold. Infant opens wide and took a few good sucks. Infant had been supplemented with formula piror to my visit. Mother advised to breastfeed infant on cue and feed at least 8-12 times in 24 hours.  Mother is active with WIC. Discussed using a wide base bottle nipple. Mother taught to pace bottle feed infant.  Mother advised to post pump every 2-3 hours for 15 mins on each breast with hand pump.  Discussed treatment and prevention of engorgement. Mother is aware of available LC services and [facility] counsel[facility] for any breast feeding assistance, questions or concerns.   Patient Name: Boy Doree AlbeeMaria Villatoro-Medrano ZOXWR'UToday's Date: 09/11/2017     Maternal Data    Feeding    LATCH Score                   Interventions    Lactation Tools Discussed/Used     Consult Status      Michel BickersKendrick, Makyla Bye McCoy 09/11/2017, 2:42 PM

## 2017-09-11 NOTE — Discharge Instructions (Signed)
Postpartum Care After Vaginal Delivery °The period of time right after you deliver your newborn is called the postpartum period. °What kind of medical care will I receive? °· You may continue to receive fluids and medicines through an IV tube inserted into one of your veins. °· If an incision was made near your vagina (episiotomy) or if you had some vaginal tearing during delivery, cold compresses may be placed on your episiotomy or your tear. This helps to reduce pain and swelling. °· You may be given a squirt bottle to use when you go to the bathroom. You may use this until you are comfortable wiping as usual. To use the squirt bottle, follow these steps: °? Before you urinate, fill the squirt bottle with warm water. Do not use hot water. °? After you urinate, while you are sitting on the toilet, use the squirt bottle to rinse the area around your urethra and vaginal opening. This rinses away any urine and blood. °? You may do this instead of wiping. As you start healing, you may use the squirt bottle before wiping yourself. Make sure to wipe gently. °? Fill the squirt bottle with clean water every time you use the bathroom. °· You will be given sanitary pads to wear. °How can I expect to feel? °· You may not feel the need to urinate for several hours after delivery. °· You will have some soreness and pain in your abdomen and vagina. °· If you are breastfeeding, you may have uterine contractions every time you breastfeed for up to several weeks postpartum. Uterine contractions help your uterus return to its normal size. °· It is normal to have vaginal bleeding (lochia) after delivery. The amount and appearance of lochia is often similar to a menstrual period in the first week after delivery. It will gradually decrease over the next few weeks to a dry, yellow-brown discharge. For most women, lochia stops completely by 6-8 weeks after delivery. Vaginal bleeding can vary from woman to woman. °· Within the first few  days after delivery, you may have breast engorgement. This is when your breasts feel heavy, full, and uncomfortable. Your breasts may also throb and feel hard, tightly stretched, warm, and tender. After this occurs, you may have milk leaking from your breasts. Your health care provider can help you relieve discomfort due to breast engorgement. Breast engorgement should go away within a few days. °· You may feel more sad or worried than normal due to hormonal changes after delivery. These feelings should not last more than a few days. If these feelings do not go away after several days, speak with your health care provider. °How should I care for myself? °· Tell your health care provider if you have pain or discomfort. °· Drink enough water to keep your urine clear or pale yellow. °· Wash your hands thoroughly with soap and water for at least 20 seconds after changing your sanitary pads, after using the toilet, and before holding or feeding your baby. °· If you are not breastfeeding, avoid touching your breasts a lot. Doing this can make your breasts produce more milk. °· If you become weak or lightheaded, or you feel like you might faint, ask for help before: °? Getting out of bed. °? Showering. °· Change your sanitary pads frequently. Watch for any changes in your flow, such as a sudden increase in volume, a change in color, the passing of large blood clots. If you pass a blood clot from your vagina, save it   to show to your health care provider. Do not flush blood clots down the toilet without having your health care provider look at them. °· Make sure that all your vaccinations are up to date. This can help protect you and your baby from getting certain diseases. You may need to have immunizations done before you leave the hospital. °· If desired, talk with your health care provider about methods of family planning or birth control (contraception). °How can I start bonding with my baby? °Spending as much time as  possible with your baby is very important. During this time, you and your baby can get to know each other and develop a bond. Having your baby stay with you in your room (rooming in) can give you time to get to know your baby. Rooming in can also help you become comfortable caring for your baby. Breastfeeding can also help you bond with your baby. °How can I plan for returning home with my baby? °· Make sure that you have a car seat installed in your vehicle. °? Your car seat should be checked by a certified car seat installer to make sure that it is installed safely. °? Make sure that your baby fits into the car seat safely. °· Ask your health care provider any questions you have about caring for yourself or your baby. Make sure that you are able to contact your health care provider with any questions after leaving the hospital. °This information is not intended to replace advice given to you by your health care provider. Make sure you discuss any questions you have with your health care provider. °Document Released: 07/28/2007 Document Revised: 03/04/2016 Document Reviewed: 09/04/2015 °Elsevier Interactive Patient Education © 2018 Elsevier Inc. ° °

## 2017-10-22 ENCOUNTER — Ambulatory Visit: Payer: Self-pay | Admitting: Student

## 2017-10-23 ENCOUNTER — Encounter: Payer: Self-pay | Admitting: Obstetrics & Gynecology

## 2017-10-23 ENCOUNTER — Ambulatory Visit (INDEPENDENT_AMBULATORY_CARE_PROVIDER_SITE_OTHER): Payer: Medicaid Other | Admitting: Obstetrics & Gynecology

## 2017-10-23 DIAGNOSIS — Z3689 Encounter for other specified antenatal screening: Secondary | ICD-10-CM

## 2017-10-23 NOTE — Patient Instructions (Signed)
Etonogestrel implant What is this medicine? ETONOGESTREL (et oh noe JES trel) is a contraceptive (birth control) device. It is used to prevent pregnancy. It can be used for up to 3 years. This medicine may be used for other purposes; ask your health care provider or pharmacist if you have questions. COMMON BRAND NAME(S): Implanon, Nexplanon What should I tell my health care provider before I take this medicine? They need to know if you have any of these conditions: -abnormal vaginal bleeding -blood vessel disease or blood clots -cancer of the breast, cervix, or liver -depression -diabetes -gallbladder disease -headaches -heart disease or recent heart attack -high blood pressure -high cholesterol -kidney disease -liver disease -renal disease -seizures -tobacco smoker -an unusual or allergic reaction to etonogestrel, other hormones, anesthetics or antiseptics, medicines, foods, dyes, or preservatives -pregnant or trying to get pregnant -breast-feeding How should I use this medicine? This device is inserted just under the skin on the inner side of your upper arm by a health care professional. Talk to your pediatrician regarding the use of this medicine in children. Special care may be needed. Overdosage: If you think you have taken too much of this medicine contact a poison control center or emergency room at once. NOTE: This medicine is only for you. Do not share this medicine with others. What if I miss a dose? This does not apply. What may interact with this medicine? Do not take this medicine with any of the following medications: -amprenavir -bosentan -fosamprenavir This medicine may also interact with the following medications: -barbiturate medicines for inducing sleep or treating seizures -certain medicines for fungal infections like ketoconazole and itraconazole -grapefruit juice -griseofulvin -medicines to treat seizures like carbamazepine, felbamate, oxcarbazepine,  phenytoin, topiramate -modafinil -phenylbutazone -rifampin -rufinamide -some medicines to treat HIV infection like atazanavir, indinavir, lopinavir, nelfinavir, tipranavir, ritonavir -St. John's wort This list may not describe all possible interactions. Give your health care provider a list of all the medicines, herbs, non-prescription drugs, or dietary supplements you use. Also tell them if you smoke, drink alcohol, or use illegal drugs. Some items may interact with your medicine. What should I watch for while using this medicine? This product does not protect you against HIV infection (AIDS) or other sexually transmitted diseases. You should be able to feel the implant by pressing your fingertips over the skin where it was inserted. Contact your doctor if you cannot feel the implant, and use a non-hormonal birth control method (such as condoms) until your doctor confirms that the implant is in place. If you feel that the implant may have broken or become bent while in your arm, contact your healthcare provider. What side effects may I notice from receiving this medicine? Side effects that you should report to your doctor or health care professional as soon as possible: -allergic reactions like skin rash, itching or hives, swelling of the face, lips, or tongue -breast lumps -changes in emotions or moods -depressed mood -heavy or prolonged menstrual bleeding -pain, irritation, swelling, or bruising at the insertion site -scar at site of insertion -signs of infection at the insertion site such as fever, and skin redness, pain or discharge -signs of pregnancy -signs and symptoms of a blood clot such as breathing problems; changes in vision; chest pain; severe, sudden headache; pain, swelling, warmth in the leg; trouble speaking; sudden numbness or weakness of the face, arm or leg -signs and symptoms of liver injury like dark yellow or brown urine; general ill feeling or flu-like symptoms;  light-colored   stools; loss of appetite; nausea; right upper belly pain; unusually weak or tired; yellowing of the eyes or skin -unusual vaginal bleeding, discharge -signs and symptoms of a stroke like changes in vision; confusion; trouble speaking or understanding; severe headaches; sudden numbness or weakness of the face, arm or leg; trouble walking; dizziness; loss of balance or coordination Side effects that usually do not require medical attention (report to your doctor or health care professional if they continue or are bothersome): -acne -back pain -breast pain -changes in weight -dizziness -general ill feeling or flu-like symptoms -headache -irregular menstrual bleeding -nausea -sore throat -vaginal irritation or inflammation This list may not describe all possible side effects. Call your doctor for medical advice about side effects. You may report side effects to FDA at 1-800-FDA-1088. Where should I keep my medicine? This drug is given in a hospital or clinic and will not be stored at home. NOTE: This sheet is a summary. It may not cover all possible information. If you have questions about this medicine, talk to your doctor, pharmacist, or health care provider.  2018 Elsevier/Gold Standard (2016-04-18 11:19:22)  

## 2017-10-23 NOTE — Progress Notes (Signed)
Subjective:     Casey Manning is a 22 y.o. female who presents for a postpartum visit. She is 6 weeks postpartum following a spontaneous vaginal delivery. I have fully reviewed the prenatal and intrapartum course. The delivery was at 39 gestational weeks. Outcome: spontaneous vaginal delivery. Anesthesia: epidural. Postpartum course has been uncomplicated. Baby's course has been unremarkable. Baby is feeding by bottle - formula. Bleeding freq spotting. Dark color. Bowel function is normal. Bladder function is normal. Patient is not sexually active. Contraception method is Nexplanon. Postpartum depression screening: negative.  The following portions of the patient's history were reviewed and updated as appropriate: allergies, current medications, past family history, past medical history, past social history, past surgical history and problem list.  Review of Systems Pertinent items are noted in HPI.   Objective:  BP 113/68   Pulse 66   Wt 204 lb 1.6 oz (92.6 kg)   Breastfeeding? No   BMI 36.15 kg/m   CONSTITUTIONAL: Well-developed, well-nourished female in no acute distress.  HENT:  Normocephalic, atraumatic EYES: Conjunctivae and EOM are normal. No scleral icterus.  NECK: Normal range of motion SKIN: Skin is warm and dry. No rash noted. Not diaphoretic.No pallor. NEUROLGIC: Alert and oriented to person, place, and time. Normal coordination.  Abd: obese; NT, ND        Assessment:     6 weeks postpartum exam. Pap smear not indicated at today's visit.   H/o depression. PP depression screen neg  Plan:    1. Contraception: Nexplanon 2. Spotting most likely due to Nexplanon.  3. Follow up in: 6 weeks or as needed.    Syre Knerr L. Harraway-Smith, M.D., [facility] CoreFACOG

## 2017-11-18 ENCOUNTER — Ambulatory Visit (HOSPITAL_COMMUNITY)
Admission: EM | Admit: 2017-11-18 | Discharge: 2017-11-18 | Disposition: A | Discharge status: Home / Self Care | Payer: Medicaid Other | Attending: Emergency Medicine | Admitting: Emergency Medicine

## 2017-11-18 ENCOUNTER — Encounter (HOSPITAL_COMMUNITY): Payer: Self-pay | Admitting: Emergency Medicine

## 2017-11-18 ENCOUNTER — Other Ambulatory Visit: Payer: Self-pay

## 2017-11-18 DIAGNOSIS — J00 Acute nasopharyngitis [common cold]: Secondary | ICD-10-CM

## 2017-11-18 MED ORDER — CROMOLYN SODIUM 5.2 MG/ACT NA AERS: 1.0000 | INHALATION_SPRAY | Freq: Four times a day (QID) | NASAL | 12 refills | Status: DC

## 2017-11-18 MED ORDER — CETIRIZINE-PSEUDOEPHEDRINE ER 5-120 MG PO TB12: 1.0000 | ORAL_TABLET | Freq: Every day | ORAL | 0 refills | Status: DC

## 2017-11-18 NOTE — ED Provider Notes (Signed)
[facility]    CSN: [id number] Arrival date & time: 11/18/17  1318     History   Chief Complaint Chief Complaint  Patient presents with  . URI    HPI Casey Manning is a 22 y.o. female.   Casey Manning presents with her mother with complaints of congestion, post nasal drip, cough, sneezing, runny nose which started 1 week ago. Without fevers, gi/gu complaints, shortness of breath, ear pain. Has not taken any medications for symptoms. History of allergies, does not take any medications for this. Without pain.   ROS per HPI.       Past Medical History:  Diagnosis Date  . Seasonal allergies     Patient Active Problem List   Diagnosis Date Noted  . SVD (spontaneous vaginal delivery) 09/11/2017  . Polyhydramnios 09/08/2017  . Supervision of high risk pregnancy, antepartum 07/31/2017  . GBS bacteriuria 07/31/2017  . Marijuana use 07/31/2017  . Polyhydramnios in third trimester, antepartum complication 07/31/2017  . Depression affecting pregnancy 07/31/2017  . Migraine without aura and without status migrainosus, not intractable 06/13/2016    Past Surgical History:  Procedure Laterality Date  . NO PAST SURGERIES      OB History    Gravida Para Term Preterm AB Living   1 1 1  0 0 1   SAB TAB Ectopic Multiple Live Births   0 0 0 0 1       Home Medications    Prior to Admission medications   Medication Sig Start Date End Date Taking? Authorizing Provider  cetirizine-pseudoephedrine (ZYRTEC-D) 5-120 MG tablet Take 1 tablet by mouth daily. 11/18/17   Georgetta Haber, NP  cromolyn (NASALCROM) 5.2 MG/ACT nasal spray Place 1 spray into both nostrils 4 (four) times daily. 11/18/17   Georgetta Haber, NP  ibuprofen (ADVIL,MOTRIN) 600 MG tablet Take 1 tablet (600 mg total) by mouth every 6 (six) hours. 09/11/17   Degele, Kandra Nicolas, MD  Prenatal Vit-Fe Fumarate-FA (PRENATAL 1 PLUS 1 PO) Take 1 tablet by mouth daily.    [provider]    Family  History History reviewed. No pertinent family history.  Social History Social History   Tobacco Use  . Smoking status: Never Smoker  . Smokeless tobacco: Never Used  Substance Use Topics  . Alcohol use: No  . Drug use: Yes    Types: Marijuana    Comment: none since delivery     Allergies   Patient has no known allergies.   Review of Systems Review of Systems   Physical Exam Triage Vital Signs ED Triage Vitals  Enc Vitals Group     BP 11/18/17 1517 (!) 104/59     Pulse Rate 11/18/17 1517 84     Resp 11/18/17 1517 20     Temp 11/18/17 1517 98.4 F (36.9 C)     Temp Source 11/18/17 1517 Oral     SpO2 11/18/17 1517 99 %     Weight --      Height --      Head Circumference --      Peak Flow --      Pain Score 11/18/17 1515 6     Pain Loc --      Pain Edu? --      Excl. in GC? --    No data found.  Updated Vital Signs BP (!) 104/59 (BP Location: Left Arm) Comment (BP Location): large cuff  Pulse 84   Temp 98.4 F (36.9 C) (Oral)  Resp 20   SpO2 99%   Visual Acuity Right Eye Distance:   Left Eye Distance:   Bilateral Distance:    Right Eye Near:   Left Eye Near:    Bilateral Near:     Physical Exam  Constitutional: She is oriented to person, place, and time. She appears well-developed and well-nourished. No distress.  HENT:  Head: Normocephalic and atraumatic.  Right Ear: Tympanic membrane, external ear and ear canal normal.  Left Ear: Tympanic membrane, external ear and ear canal normal.  Nose: Nose normal.  Mouth/Throat: Uvula is midline, oropharynx is clear and moist and mucous membranes are normal. No tonsillar exudate.  Eyes: Conjunctivae and EOM are normal. Pupils are equal, round, and reactive to light.  Cardiovascular: Normal rate, regular rhythm and normal heart sounds.  Pulmonary/Chest: Effort normal and breath sounds normal.  Neurological: She is alert and oriented to person, place, and time.  Skin: Skin is warm and dry.     UC  Treatments / Results  Labs (all labs ordered are listed, but only abnormal results are displayed) Labs Reviewed - No data to display  EKG  EKG Interpretation None       Radiology No results found.  Procedures Procedures (including critical care time)  Medications Ordered in UC Medications - No data to display   Initial Impression / Assessment and Plan / UC Course  I have reviewed the triage vital signs and the nursing notes.  Pertinent labs & imaging results that were available during my care of the patient were reviewed by me and considered in my medical decision making (see chart for details).     Without acute findings on exam. History and physical consistent with viral illness.  Supportive cares recommended. If symptoms worsen or do not improve in the next week to return to be seen or to follow up with PCP.  Patient verbalized understanding and agreeable to plan.    Final Clinical Impressions(s) / UC Diagnoses   Final diagnoses:  Acute nasopharyngitis    ED Discharge Orders        Ordered    cetirizine-pseudoephedrine (ZYRTEC-D) 5-120 MG tablet  Daily     11/18/17 1547    cromolyn (NASALCROM) 5.2 MG/ACT nasal spray  4 times daily     11/18/17 1547       Controlled Substance Prescriptions [place] Controlled Substance Registry consulted? Not Applicable   Georgetta HaberBurky, Amye Grego B, NP 11/18/17 1606

## 2017-11-18 NOTE — ED Triage Notes (Signed)
Symptoms started last week.  Complains of voice sounding different, itchy throat, stuffy nose.  phlegm is green per patient

## 2017-11-18 NOTE — Discharge Instructions (Signed)
Push fluids to ensure adequate hydration and keep secretions thin.  Tylenol and/or ibuprofen as needed for pain or fevers.  Zyrtec d daily to help with symptoms. Nasal spray up to 4 times a day for congestion and sore throat. If symptoms worsen or do not improve in the next week to return to be seen or to follow up with your primary care provider.

## 2017-12-01 ENCOUNTER — Ambulatory Visit: Payer: Self-pay | Admitting: Obstetrics & Gynecology

## 2019-05-25 IMAGING — US US MFM FETAL BPP W/O NON-STRESS
1 series · 15 of 28 positions shown · non-contrast
Comparison: none

[Series 1: us mfm fetal bpp w/o non-stress · 37 acquisitions, 15 frames shown]
[im 1/37]
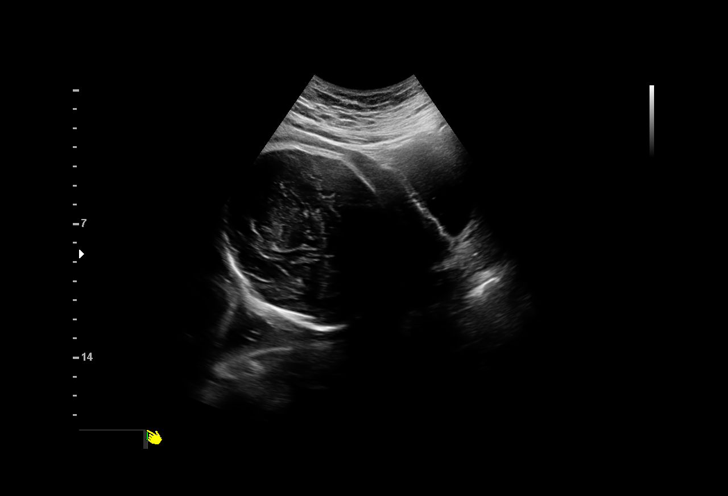
[im 3/37]
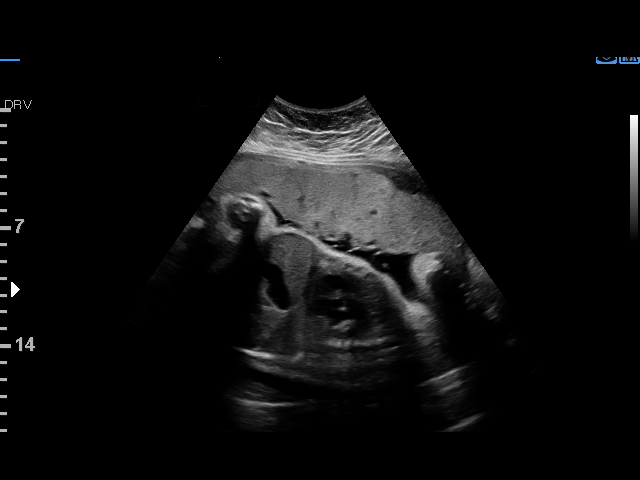
[im 6/37]
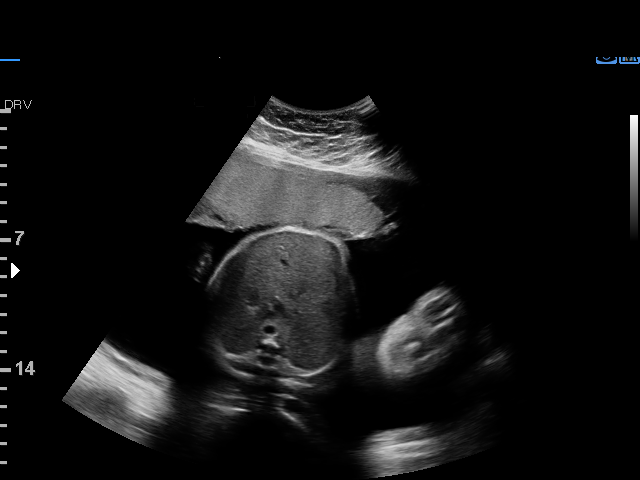
[im 9/37]
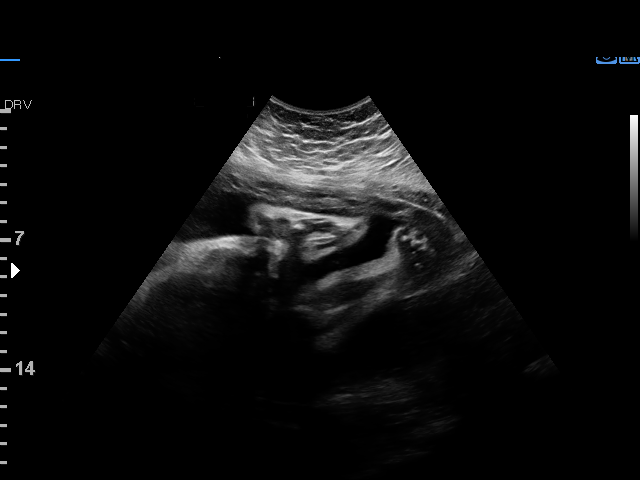
[im 11/37]
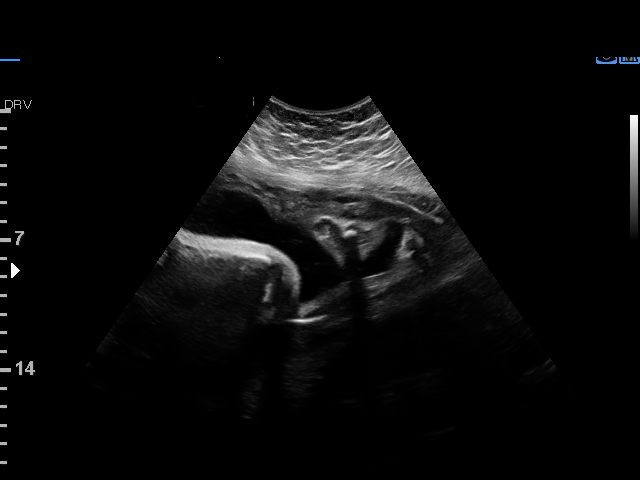
[im 14/37]
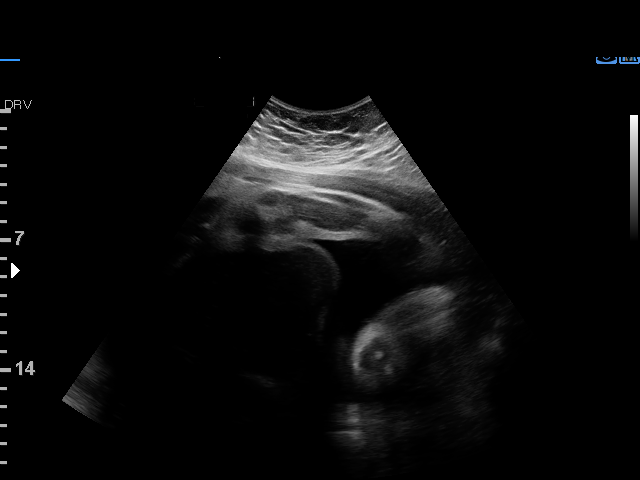
[im 17/37]
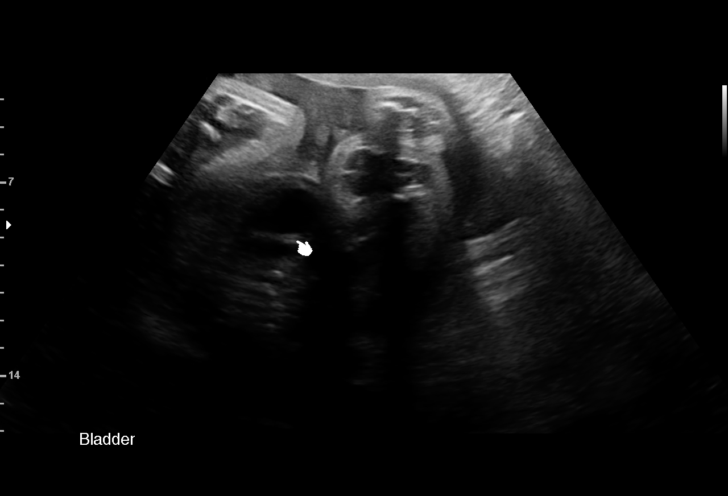
[im 19/37]
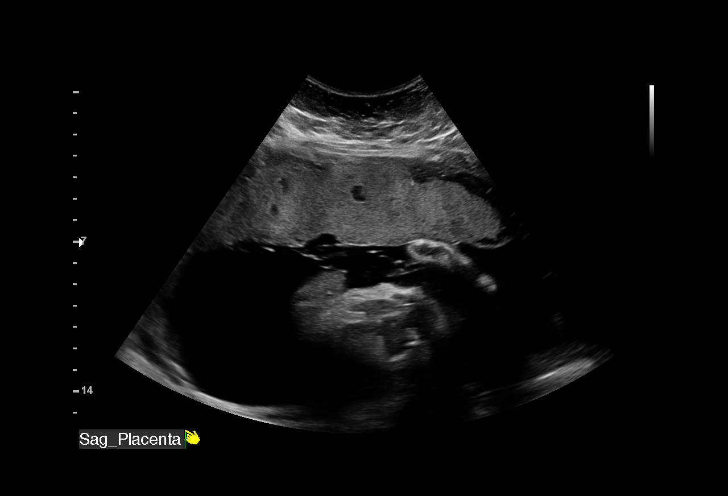
[im 21/37]
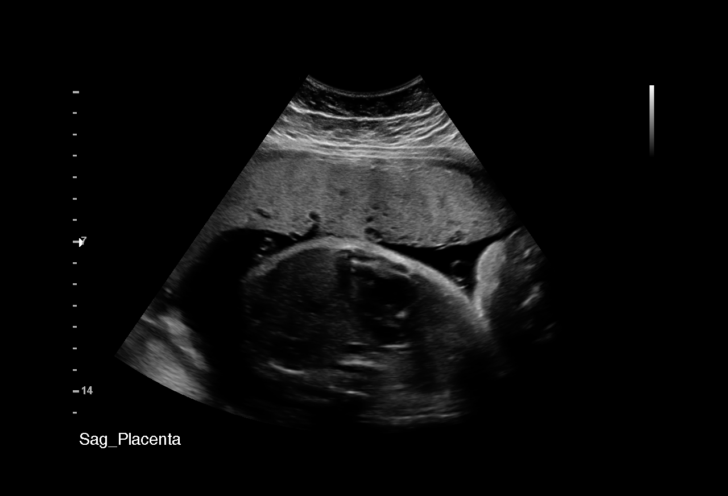
[im 23/37]
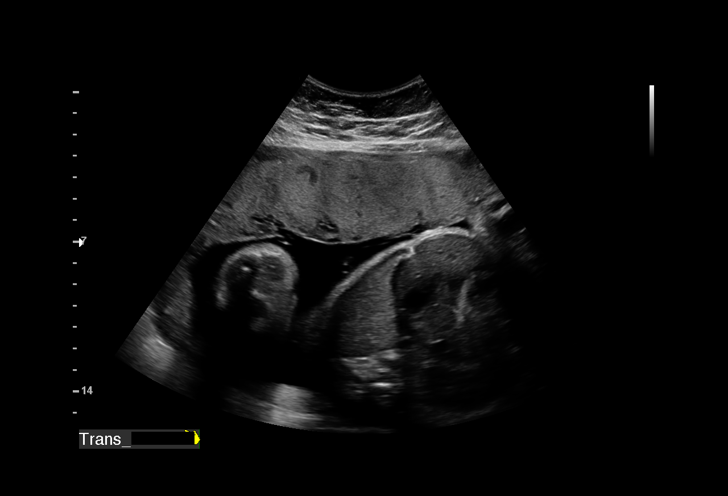
[im 26/37]
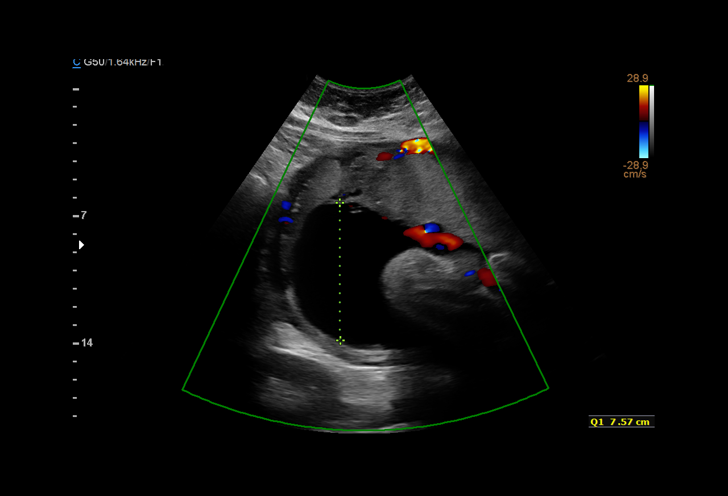
[im 29/37]
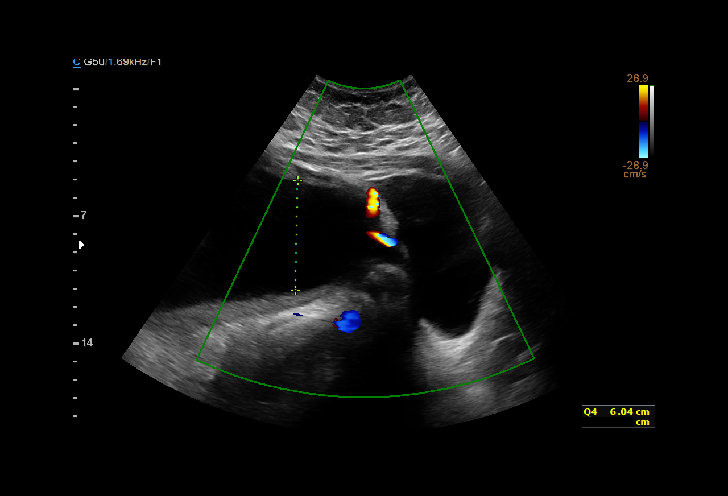
[im 31/37]
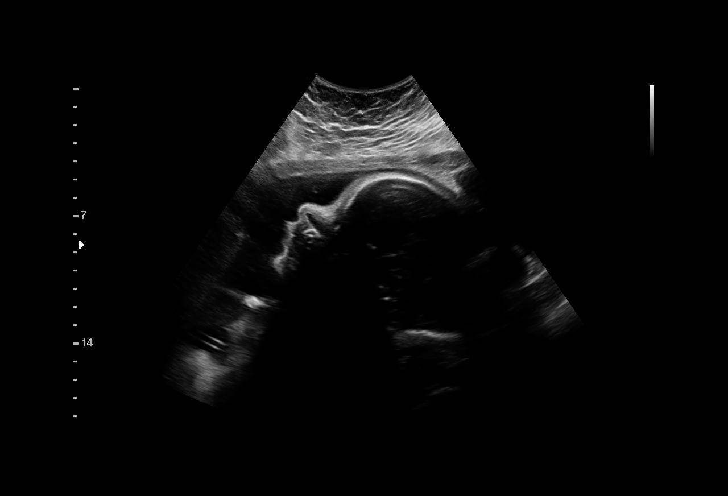
[im 34/37]
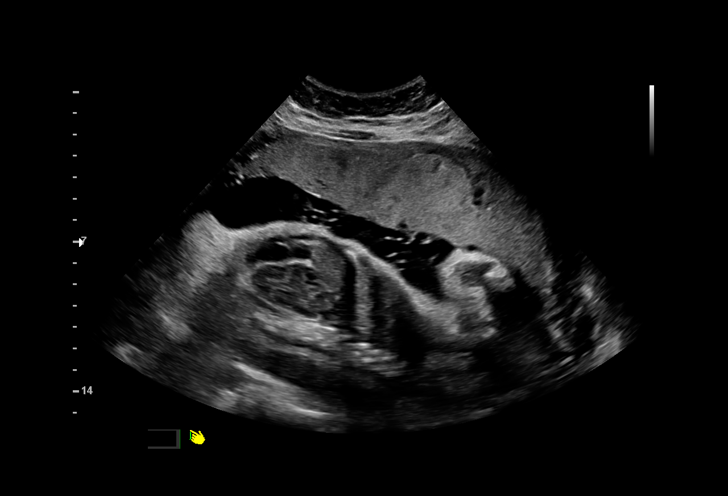
[im 37/37]
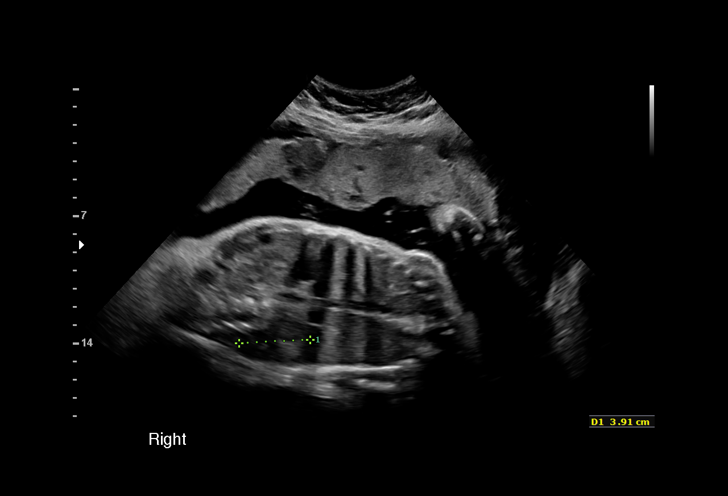

[15 of 28 positions shown; findings below may reference images not displayed]

MICHAEL ANDRES

Indications

34 weeks gestation of pregnancy
Polyhydramnios, third trimester, antepartum
condition or complication, unspecified fetus
Obesity complicating pregnancy, third
trimester
OB History

Blood Type:            Height:  5'2"   Weight (lb):  208       BMI:
Fetal Evaluation

Num Of Fetuses:     1
Fetal Heart         142
Rate(bpm):
Cardiac Activity:   Observed
Presentation:       Cephalic
Placenta:           Anterior, above cervical os

Amniotic Fluid
AFI FV:      Polyhydramnios

AFI Sum(cm)     %Tile       Largest Pocket(cm)
27.3            97

RUQ(cm)       RLQ(cm)       LUQ(cm)        LLQ(cm)
7.57
Biophysical Evaluation

Amniotic F.V:   Polyhydramnios             F. Tone:         Observed
F. Movement:    Observed                   Score:           [DATE]
F. Breathing:   Observed
Gestational Age

LMP:           34w 5d        Date:  12/01/16                 EDD:   09/07/17
Clinical EDD:  34w 0d                                        EDD:   09/12/17
Best:          34w 0d     Det. By:  Clinical EDD             EDD:   09/12/17
Anatomy

Stomach:               Appears normal, left   Bladder:                Appears normal
sided
Kidneys:               Appear normal
Cervix Uterus Adnexa

Cervix
Not visualized (advanced GA >05wks)
Impression

SIUP at 34+0 weeks
Polyhydramnios
BPP [DATE]
Recommendations

Follow-up ultrasound in 1-2 weeks for detailed anatomic
survey (polyhydramnios)
Continue twice weekly NSTs with weekly AFIs or weekly BPPs

## 2019-10-19 ENCOUNTER — Telehealth: Payer: Self-pay

## 2019-10-19 ENCOUNTER — Telehealth: Payer: Self-pay | Admitting: Family Medicine

## 2019-10-19 NOTE — Telephone Encounter (Signed)
Opened in Error.

## 2019-10-19 NOTE — Telephone Encounter (Signed)
LM for that she is not due for replacement until 08/2020.  If she continues to have questions to please give the office a call.   Addison Naegeli, RN 10/19/19

## 2019-10-19 NOTE — Telephone Encounter (Signed)
The patient called in stating she needs her nexplannon removed and replaced. Educated the patient the nexplannon has an timeframe of 3 years. The patient was adamant about the nexplannon being replaced now as she stated she was told it was to come out in 2021. Informed the patient a message would be sent to the nurse to give the patient a call back.

## 2020-02-09 ENCOUNTER — Other Ambulatory Visit: Payer: Self-pay

## 2020-02-09 ENCOUNTER — Ambulatory Visit (HOSPITAL_COMMUNITY)
Admission: EM | Admit: 2020-02-09 | Discharge: 2020-02-09 | Disposition: A | Discharge status: Home / Self Care | Payer: Medicaid Other | Attending: Family Medicine | Admitting: Family Medicine

## 2020-02-09 ENCOUNTER — Encounter (HOSPITAL_COMMUNITY): Payer: Self-pay

## 2020-02-09 DIAGNOSIS — L259 Unspecified contact dermatitis, unspecified cause: Secondary | ICD-10-CM

## 2020-02-09 MED ORDER — PREDNISONE 10 MG (48) PO TBPK: ORAL_TABLET | ORAL | 0 refills | Status: DC

## 2020-02-09 NOTE — ED Triage Notes (Signed)
Pt states she has a rash on back and it's itchies.

## 2020-02-09 NOTE — ED Provider Notes (Signed)
[facility]   [id number] 02/09/20 Arrival Time: 1337  ASSESSMENT & PLAN:  1. Contact dermatitis, unspecified contact dermatitis type, unspecified trigger     Begin: Meds ordered this encounter  Medications  . predniSONE (STERAPRED UNI-PAK 48 TAB) 10 MG (48) TBPK tablet    Sig: Take as directed.    Dispense:  48 tablet    Refill:  0    Benadryl if needed.  Will follow up with PCP or here if worsening or failing to improve as anticipated. Reviewed expectations re: course of current medical issues. Questions answered. Outlined signs and symptoms indicating need for more acute intervention. Patient verbalized understanding. After Visit Summary given.   SUBJECTIVE:  Casey Manning is a 24 y.o. female who presents with a skin complaint.   Location: left back Onset: abrupt Duration: few days Associated pruritis? significant Associated pain? none Progression: increasing steadily  Drainage? none Known trigger? in woods recently; question plant related New soaps/lotions/topicals/detergents? No  Contacts with similar? No  Recent travel? No  Other associated symptoms: none Therapies tried thus far: Benadryl; question mild help "but not much" Arthralgia or myalgia? none Recent illness? none Fever? none New medications? none No specific aggravating or alleviating factors reported.   OBJECTIVE: Vitals:   02/09/20 1348 02/09/20 1349 02/09/20 1351  BP:   130/70  Pulse: 88  96  Resp: 18  18  Temp:   98.6 F (37 C)  TempSrc: Oral  Oral  SpO2: 100%  100%  Weight:  113.4 kg     General appearance: alert; no distress HEENT: [place]; AT Neck: supple with FROM Lungs: clear to auscultation bilaterally Heart: regular rate and rhythm Extremities: no edema; moves all extremities normally Skin: warm and dry; signs of infection: no; areas of linear papules and vesicles with surrounding erythema over lower left back Psychological: alert and cooperative; normal  mood and affect  No Known Allergies  Past Medical History:  Diagnosis Date  . Seasonal allergies    Social History   Socioeconomic History  . Marital status: Single    Spouse name: Not on file  . Number of children: Not on file  . Years of education: Not on file  . Highest education level: Not on file  Occupational History  . Not on file  Tobacco Use  . Smoking status: Never Smoker  . Smokeless tobacco: Never Used  Substance and Sexual Activity  . Alcohol use: No  . Drug use: Yes    Types: Marijuana    Comment: none since delivery  . Sexual activity: Not Currently    Birth control/protection: Condom  Other Topics Concern  . Not on file  Social History Narrative   ** Merged History Encounter **       Social Determinants of Health   Financial Resource Strain:   . Difficulty of Paying Living Expenses:   Food Insecurity:   . Worried About Programme researcher, broadcasting/film/video in the Last Year:   . Barista in the Last Year:   Transportation Needs:   . Freight forwarder (Medical):   Marland Kitchen Lack of Transportation (Non-Medical):   Physical Activity:   . Days of Exercise per Week:   . Minutes of Exercise per Session:   Stress:   . Feeling of Stress :   Social Connections:   . Frequency of Communication with Friends and Family:   . Frequency of Social Gatherings with Friends and Family:   . Attends Religious Services:   . Active  Member of Clubs or Organizations:   . Attends Archivist Meetings:   Marland Kitchen Marital Status:   Intimate Partner Violence:   . Fear of Current or Ex-Partner:   . Emotionally Abused:   Marland Kitchen Physically Abused:   . Sexually Abused:    History reviewed. No pertinent family history. Past Surgical History:  Procedure Laterality Date  . NO PAST SURGERIES       Vanessa Kick, MD 02/09/20 [phone]

## 2020-05-15 ENCOUNTER — Encounter (HOSPITAL_COMMUNITY): Payer: Self-pay | Admitting: Emergency Medicine

## 2020-05-15 ENCOUNTER — Ambulatory Visit (HOSPITAL_COMMUNITY)
Admission: EM | Admit: 2020-05-15 | Discharge: 2020-05-15 | Disposition: A | Discharge status: Home / Self Care | Payer: Medicaid Other | Attending: Family Medicine | Admitting: Family Medicine

## 2020-05-15 DIAGNOSIS — F1290 Cannabis use, unspecified, uncomplicated: Secondary | ICD-10-CM | POA: Diagnosis not present

## 2020-05-15 DIAGNOSIS — J069 Acute upper respiratory infection, unspecified: Secondary | ICD-10-CM

## 2020-05-15 DIAGNOSIS — U071 COVID-19: Secondary | ICD-10-CM | POA: Insufficient documentation

## 2020-05-15 NOTE — Discharge Instructions (Signed)
Push fluids to ensure adequate hydration and keep secretions thin.  Tylenol and/or ibuprofen as needed for pain or fevers.  Over the counter medications as needed for symptoms, such as mucinex d.  Self isolate until covid results are back and negative.  Will notify you by phone of any positive findings. Your negative results will be sent through your MyChart.     If symptoms worsen or do not improve in the next week to return to be seen or to follow up with your PCP.

## 2020-05-15 NOTE — ED Triage Notes (Signed)
Patient presents to urgent care today with symptoms of congestion, productive cough with green sputum, runny nose, hot and cold chills. Symptoms began on about 2 days ago. They have tried Nyquil with some relief of symptoms.

## 2020-05-15 NOTE — ED Provider Notes (Signed)
[facility]    CSN: [id number] Arrival date & time: 05/15/20  1310      History   Chief Complaint Chief Complaint  Patient presents with  . URI  . Headache  . Chills    HPI Casey Manning is a 23 y.o. female.   Casey Manning presents with complaints of nasal congestion, and productive coughing. No specific known fevers but felt hot this morning. Nausea. Symptoms started two days. No shortness of breath. Headache. No body aches. No vomiting or diarrhea. No rash. Sore throat. No ear pain. No known ill contacts. Doesn't work but does attend school. No history of covid-19 and has not received vaccination. Took nyquil yesterday, didn't help with symptoms.    ROS per HPI, negative if not otherwise mentioned.      Past Medical History:  Diagnosis Date  . Seasonal allergies     Patient Active Problem List   Diagnosis Date Noted  . SVD (spontaneous vaginal delivery) 09/11/2017  . Polyhydramnios 09/08/2017  . Supervision of high risk pregnancy, antepartum 07/31/2017  . GBS bacteriuria 07/31/2017  . Marijuana use 07/31/2017  . Polyhydramnios in third trimester, antepartum complication 07/31/2017  . Depression affecting pregnancy 07/31/2017  . Migraine without aura and without status migrainosus, not intractable 06/13/2016    Past Surgical History:  Procedure Laterality Date  . NO PAST SURGERIES      OB History    Gravida  1   Para  1   Term  1   Preterm  0   AB  0   Living  1     SAB  0   TAB  0   Ectopic  0   Multiple  0   Live Births  1            Home Medications    Prior to Admission medications   Medication Sig Start Date End Date Taking? Authorizing Provider  cetirizine-pseudoephedrine (ZYRTEC-D) 5-120 MG tablet Take 1 tablet by mouth daily. 11/18/17   Georgetta Haber, NP  cromolyn (NASALCROM) 5.2 MG/ACT nasal spray Place 1 spray into both nostrils 4 (four) times daily. 11/18/17   Georgetta Haber, NP    ibuprofen (ADVIL,MOTRIN) 600 MG tablet Take 1 tablet (600 mg total) by mouth every 6 (six) hours. 09/11/17   Degele, Kandra Nicolas, MD  predniSONE (STERAPRED UNI-PAK 48 TAB) 10 MG (48) TBPK tablet Take as directed. 02/09/20   Mardella Layman, MD  Prenatal Vit-Fe Fumarate-FA (PRENATAL 1 PLUS 1 PO) Take 1 tablet by mouth daily.    [provider]    Family History Family History  Family history unknown: Yes    Social History Social History   Tobacco Use  . Smoking status: Never Smoker  . Smokeless tobacco: Never Used  Substance Use Topics  . Alcohol use: No  . Drug use: Yes    Types: Marijuana    Comment: none since delivery     Allergies   Patient has no known allergies.   Review of Systems Review of Systems   Physical Exam Triage Vital Signs ED Triage Vitals  Enc Vitals Group     BP 05/15/20 1351 110/70     Pulse Rate 05/15/20 1351 94     Resp 05/15/20 1351 16     Temp 05/15/20 1351 98.8 F (37.1 C)     Temp Source 05/15/20 1351 Oral     SpO2 05/15/20 1351 100 %     Weight --  Height --      Head Circumference --      Peak Flow --      Pain Score 05/15/20 1347 8     Pain Loc --      Pain Edu? --      Excl. in GC? --    No data found.  Updated Vital Signs BP 110/70 (BP Location: Left Arm)   Pulse 94   Temp 98.8 F (37.1 C) (Oral)   Resp 16   LMP 05/12/2020   SpO2 100%   Visual Acuity Right Eye Distance:   Left Eye Distance:   Bilateral Distance:    Right Eye Near:   Left Eye Near:    Bilateral Near:     Physical Exam Constitutional:      General: She is not in acute distress.    Appearance: She is well-developed.  HENT:     Mouth/Throat:     Mouth: Mucous membranes are moist.     Pharynx: Oropharynx is clear.  Cardiovascular:     Rate and Rhythm: Normal rate.  Pulmonary:     Effort: Pulmonary effort is normal.  Skin:    General: Skin is warm and dry.  Neurological:     Mental Status: She is alert and oriented to person,  place, and time.      UC Treatments / Results  Labs (all labs ordered are listed, but only abnormal results are displayed) Labs Reviewed  SARS CORONAVIRUS 2 (TAT 6-24 HRS)    EKG   Radiology No results found.  Procedures Procedures (including critical care time)  Medications Ordered in UC Medications - No data to display  Initial Impression / Assessment and Plan / UC Course  I have reviewed the triage vital signs and the nursing notes.  Pertinent labs & imaging results that were available during my care of the patient were reviewed by me and considered in my medical decision making (see chart for details).     Non toxic. Benign physical exam.  History and physical consistent with viral illness.  Supportive cares recommended. Return precautions provided. covid testing collected and pending. Patient verbalized understanding and agreeable to plan.   Final Clinical Impressions(s) / UC Diagnoses   Final diagnoses:  Viral URI with cough     Discharge Instructions     Push fluids to ensure adequate hydration and keep secretions thin.  Tylenol and/or ibuprofen as needed for pain or fevers.  Over the counter medications as needed for symptoms, such as mucinex d.  Self isolate until covid results are back and negative.  Will notify you by phone of any positive findings. Your negative results will be sent through your MyChart.     If symptoms worsen or do not improve in the next week to return to be seen or to follow up with your PCP.      ED Prescriptions    None     PDMP not reviewed this encounter.   Georgetta Haber, NP 05/15/20 1450

## 2020-05-16 LAB — SARS CORONAVIRUS 2 (TAT 6-24 HRS): SARS Coronavirus 2: POSITIVE — AB

## 2020-05-17 ENCOUNTER — Other Ambulatory Visit (HOSPITAL_COMMUNITY): Payer: Self-pay | Admitting: Oncology

## 2020-05-17 DIAGNOSIS — U071 COVID-19: Secondary | ICD-10-CM

## 2020-05-17 NOTE — Progress Notes (Signed)
I connected by phone with  Mrs. Orlena Sheldon on 05/17/2020 at 11:30am to discuss the potential use of an new treatment for mild to moderate COVID-19 viral infection in non-hospitalized patients.   This patient is a age/sex that meets the FDA criteria for Emergency Use Authorization of casirivimab\imdevimab.  Has a (+) direct SARS-CoV-2 viral test result 1. Has mild or moderate COVID-19  2. Is ? 22 years of age and weighs ? 40 kg 3. Is NOT hospitalized due to COVID-19 4. Is NOT requiring oxygen therapy or requiring an increase in baseline oxygen flow rate due to COVID-19 5. Is within 10 days of symptom onset 6. Has at least one of the high risk factor(s) for progression to severe COVID-19 and/or hospitalization as defined in EUA. ? Specific high risk criteria :Obesity   Symptom onset 05/14/2020. S/s include Runny nose, fever, cough   I have spoken and communicated the following to the patient or parent/caregiver:   1. FDA has authorized the emergency use of casirivimab\imdevimab for the treatment of mild to moderate COVID-19 in adults and pediatric patients with positive results of direct SARS-CoV-2 viral testing who are 18 years of age and older weighing at least 40 kg, and who are at high risk for progressing to severe COVID-19 and/or hospitalization.   2. The significant known and potential risks and benefits of casirivimab\imdevimab, and the extent to which such potential risks and benefits are unknown.   3. Information on available alternative treatments and the risks and benefits of those alternatives, including clinical trials.   4. Patients treated with casirivimab\imdevimab should continue to self-isolate and use infection control measures (e.g., wear mask, isolate, social distance, avoid sharing personal items, clean and disinfect "high touch" surfaces, and frequent handwashing) according to CDC guidelines.    5. The patient or parent/caregiver has the option to accept or refuse  casirivimab\imdevimab .   After reviewing this information with the patient, The patient agreed to proceed with receiving casirivimab\imdevimab infusion and will be provided a copy of the Fact sheet prior to receiving the infusion.Mignon Pine, AGNP-C [phone] (Infusion Center Hotline)

## 2020-05-18 MED ORDER — SODIUM CHLORIDE 0.9 % IV SOLN
Freq: Once | INTRAVENOUS | Status: AC
  Filled 2020-05-18: qty 600

## 2020-05-19 ENCOUNTER — Ambulatory Visit (HOSPITAL_COMMUNITY): Payer: Medicaid Other

## 2020-05-19 ENCOUNTER — Ambulatory Visit (HOSPITAL_COMMUNITY)
Admission: RE | Admit: 2020-05-19 | Discharge: 2020-05-19 | Disposition: A | Discharge status: Home / Self Care | Payer: Medicaid Other | Source: Ambulatory Visit | Attending: Pulmonary Disease | Admitting: Pulmonary Disease

## 2020-05-19 DIAGNOSIS — U071 COVID-19: Secondary | ICD-10-CM | POA: Insufficient documentation

## 2020-05-19 MED ORDER — FAMOTIDINE IN NACL 20-0.9 MG/50ML-% IV SOLN: 20.0000 mg | Freq: Once | INTRAVENOUS | Status: DC | PRN

## 2020-05-19 MED ORDER — METHYLPREDNISOLONE SODIUM SUCC 125 MG IJ SOLR: 125.0000 mg | Freq: Once | INTRAMUSCULAR | Status: DC | PRN

## 2020-05-19 MED ORDER — SODIUM CHLORIDE 0.9 % IV SOLN: INTRAVENOUS | Status: DC | PRN

## 2020-05-19 MED ORDER — EPINEPHRINE 0.3 MG/0.3ML IJ SOAJ: 0.3000 mg | Freq: Once | INTRAMUSCULAR | Status: DC | PRN

## 2020-05-19 MED ORDER — ALBUTEROL SULFATE HFA 108 (90 BASE) MCG/ACT IN AERS: 2.0000 | INHALATION_SPRAY | Freq: Once | RESPIRATORY_TRACT | Status: DC | PRN

## 2020-05-19 MED ORDER — DIPHENHYDRAMINE HCL 50 MG/ML IJ SOLN: 50.0000 mg | Freq: Once | INTRAMUSCULAR | Status: DC | PRN

## 2020-05-19 NOTE — Progress Notes (Signed)
  Diagnosis: COVID-19  Physician: Shan Levans  Procedure: Covid Infusion Clinic Med: casirivimab\imdevimab infusion - Provided patient with casirivimab\imdevimab fact sheet for patients, parents and caregivers prior to infusion.  Complications: No immediate complications noted.  Discharge: Discharged home   Shaune Spittle 05/19/2020

## 2020-05-19 NOTE — Discharge Instructions (Signed)

## 2020-11-02 ENCOUNTER — Ambulatory Visit: Payer: Medicaid Other | Admitting: Obstetrics & Gynecology

## 2020-12-14 ENCOUNTER — Ambulatory Visit: Payer: Medicaid Other | Admitting: Obstetrics & Gynecology

## 2021-02-21 ENCOUNTER — Ambulatory Visit (INDEPENDENT_AMBULATORY_CARE_PROVIDER_SITE_OTHER): Payer: Medicaid Other | Admitting: Obstetrics & Gynecology

## 2021-02-21 ENCOUNTER — Other Ambulatory Visit: Payer: Self-pay

## 2021-02-21 ENCOUNTER — Encounter: Payer: Self-pay | Admitting: Obstetrics & Gynecology

## 2021-02-21 VITALS — BP 100/55 | HR 71 | Ht 62.0 in | Wt 239.8 lb

## 2021-02-21 DIAGNOSIS — Z3046 Encounter for surveillance of implantable subdermal contraceptive: Secondary | ICD-10-CM | POA: Diagnosis not present

## 2021-02-21 MED ORDER — ETONOGESTREL 68 MG ~~LOC~~ IMPL
68.0000 mg | DRUG_IMPLANT | Freq: Once | SUBCUTANEOUS | Status: AC
Start: 2021-02-21 — End: 2021-02-21
  Administered 2021-02-21: 68 mg via SUBCUTANEOUS

## 2021-02-21 NOTE — Progress Notes (Signed)
GYNECOLOGY OFFICE PROCEDURE NOTE  Casey Manning is a 25 y.o. G1P1001 here for Nexplanon removal and Nexplanon insertion.   No other gynecologic concerns.      Nexplanon Removal and Insertion  Patient identified, informed consent performed, consent signed.   Patient does understand that irregular bleeding is a very common side effect of this medication. She was advised to have backup contraception for one week after replacement of the implant. Pregnancy test in clinic today was negative.  Appropriate time out taken. Implanon site identified. Area prepped in usual sterile fashon. One ml of 1% lidocaine was used to anesthetize the area at the distal end of the implant. A small stab incision was made right beside the implant on the distal portion. The Nexplanon rod was grasped and removed without difficulty. There was minimal blood loss. There were no complications. Area was then injected with 3 ml of 1 % lidocaine. She was re-prepped with betadine, Nexplanon removed from packaging, Device confirmed in needle, then inserted full length of needle and withdrawn per handbook instructions. Nexplanon was able to palpated in the patient's arm; patient palpated the insert herself.  There was minimal blood loss. Patient insertion site covered with gauze and a pressure bandage to reduce any bruising. The patient tolerated the procedure well and was given post procedure instructions.  She was advised to have backup contraception for one week.    Adam Phenix, MD Attending Obstetrician & Gynecologist, [facility]  02/21/2021

## 2021-02-21 NOTE — Patient Instructions (Signed)
Etonogestrel implant What is this medicine? ETONOGESTREL (et oh noe JES trel) is a contraceptive (birth control) device. It is used to prevent pregnancy. It can be used for up to 3 years. This medicine may be used for other purposes; ask your health care provider or pharmacist if you have questions. COMMON BRAND NAME(S): Implanon, Nexplanon What should I tell my health care provider before I take this medicine? They need to know if you have any of these conditions:  abnormal vaginal bleeding  blood vessel disease or blood clots  breast, cervical, endometrial, ovarian, liver, or uterine cancer  diabetes  gallbladder disease  heart disease or recent heart attack  high blood pressure  high cholesterol or triglycerides  kidney disease  liver disease  migraine headaches  seizures  stroke  tobacco smoker  an unusual or allergic reaction to etonogestrel, anesthetics or antiseptics, other medicines, foods, dyes, or preservatives  pregnant or trying to get pregnant  breast-feeding How should I use this medicine? This device is inserted just under the skin on the inner side of your upper arm by a health care professional. Talk to your pediatrician regarding the use of this medicine in children. Special care may be needed. Overdosage: If you think you have taken too much of this medicine contact a poison control center or emergency room at once. NOTE: This medicine is only for you. Do not share this medicine with others. What if I miss a dose? This does not apply. What may interact with this medicine? Do not take this medicine with any of the following medications:  amprenavir  fosamprenavir This medicine may also interact with the following medications:  acitretin  aprepitant  armodafinil  bexarotene  bosentan  carbamazepine  certain medicines for fungal infections like fluconazole, ketoconazole, itraconazole and voriconazole  certain medicines to treat  hepatitis, HIV or AIDS  cyclosporine  felbamate  griseofulvin  lamotrigine  modafinil  oxcarbazepine  phenobarbital  phenytoin  primidone  rifabutin  rifampin  rifapentine  St. John's wort  topiramate This list may not describe all possible interactions. Give your health care provider a list of all the medicines, herbs, non-prescription drugs, or dietary supplements you use. Also tell them if you smoke, drink alcohol, or use illegal drugs. Some items may interact with your medicine. What should I watch for while using this medicine? This product does not protect you against HIV infection (AIDS) or other sexually transmitted diseases. You should be able to feel the implant by pressing your fingertips over the skin where it was inserted. Contact your doctor if you cannot feel the implant, and use a non-hormonal birth control method (such as condoms) until your doctor confirms that the implant is in place. Contact your doctor if you think that the implant may have broken or become bent while in your arm. You will receive a user card from your health care provider after the implant is inserted. The card is a record of the location of the implant in your upper arm and when it should be removed. Keep this card with your health records. What side effects may I notice from receiving this medicine? Side effects that you should report to your doctor or health care professional as soon as possible:  allergic reactions like skin rash, itching or hives, swelling of the face, lips, or tongue  breast lumps, breast tissue changes, or discharge  breathing problems  changes in emotions or moods  coughing up blood  if you feel that the implant   may have broken or bent while in your arm  high blood pressure  pain, irritation, swelling, or bruising at the insertion site  scar at site of insertion  signs of infection at the insertion site such as fever, and skin redness, pain or  discharge  signs and symptoms of a blood clot such as breathing problems; changes in vision; chest pain; severe, sudden headache; pain, swelling, warmth in the leg; trouble speaking; sudden numbness or weakness of the face, arm or leg  signs and symptoms of liver injury like dark yellow or brown urine; general ill feeling or flu-like symptoms; light-colored stools; loss of appetite; nausea; right upper belly pain; unusually weak or tired; yellowing of the eyes or skin  unusual vaginal bleeding, discharge Side effects that usually do not require medical attention (report to your doctor or health care professional if they continue or are bothersome):  acne  breast pain or tenderness  headache  irregular menstrual bleeding  nausea This list may not describe all possible side effects. Call your doctor for medical advice about side effects. You may report side effects to FDA at 1-800-FDA-1088. Where should I keep my medicine? This drug is given in a hospital or clinic and will not be stored at home. NOTE: This sheet is a summary. It may not cover all possible information. If you have questions about this medicine, talk to your doctor, pharmacist, or health care provider.  2021 Elsevier/Gold Standard (2019-07-13 11:33:04)  

## 2021-02-21 NOTE — Addendum Note (Signed)
Addended byVidal Schwalbe on: 02/21/2021 11:33 AM   Modules accepted: Orders

## 2021-05-24 ENCOUNTER — Encounter (HOSPITAL_COMMUNITY): Payer: Self-pay

## 2021-05-24 ENCOUNTER — Emergency Department (HOSPITAL_BASED_OUTPATIENT_CLINIC_OR_DEPARTMENT_OTHER): Payer: Medicaid Other

## 2021-05-24 ENCOUNTER — Emergency Department (HOSPITAL_COMMUNITY)
Admission: EM | Admit: 2021-05-24 | Discharge: 2021-05-24 | Disposition: A | Discharge status: Home / Self Care | Payer: Medicaid Other | Attending: Emergency Medicine | Admitting: Emergency Medicine

## 2021-05-24 ENCOUNTER — Other Ambulatory Visit: Payer: Self-pay

## 2021-05-24 ENCOUNTER — Emergency Department (HOSPITAL_BASED_OUTPATIENT_CLINIC_OR_DEPARTMENT_OTHER)
Admission: EM | Admit: 2021-05-24 | Discharge: 2021-05-24 | Disposition: A | Discharge status: Home / Self Care | Payer: Medicaid Other | Source: Home / Self Care | Attending: Emergency Medicine | Admitting: Emergency Medicine

## 2021-05-24 ENCOUNTER — Encounter (HOSPITAL_BASED_OUTPATIENT_CLINIC_OR_DEPARTMENT_OTHER): Payer: Self-pay | Admitting: *Deleted

## 2021-05-24 DIAGNOSIS — R1011 Right upper quadrant pain: Secondary | ICD-10-CM

## 2021-05-24 DIAGNOSIS — K8020 Calculus of gallbladder without cholecystitis without obstruction: Secondary | ICD-10-CM | POA: Insufficient documentation

## 2021-05-24 DIAGNOSIS — Z5321 Procedure and treatment not carried out due to patient leaving prior to being seen by health care provider: Secondary | ICD-10-CM | POA: Insufficient documentation

## 2021-05-24 DIAGNOSIS — R1012 Left upper quadrant pain: Secondary | ICD-10-CM | POA: Diagnosis present

## 2021-05-24 DIAGNOSIS — R197 Diarrhea, unspecified: Secondary | ICD-10-CM | POA: Insufficient documentation

## 2021-05-24 DIAGNOSIS — R1110 Vomiting, unspecified: Secondary | ICD-10-CM | POA: Diagnosis not present

## 2021-05-24 LAB — COMPREHENSIVE METABOLIC PANEL
ALT: 25 U/L (ref 0–44)
AST: 18 U/L (ref 15–41)
Albumin: 4.3 g/dL (ref 3.5–5.0)
Alkaline Phosphatase: 74 U/L (ref 38–126)
Anion gap: 9 (ref 5–15)
BUN: 8 mg/dL (ref 6–20)
CO2: 21 mmol/L — ABNORMAL LOW (ref 22–32)
Calcium: 8.9 mg/dL (ref 8.9–10.3)
Chloride: 106 mmol/L (ref 98–111)
Creatinine, Ser: 0.42 mg/dL — ABNORMAL LOW (ref 0.44–1.00)
GFR, Estimated: >60 mL/min (ref >60)
Glucose, Bld: 118 mg/dL — ABNORMAL HIGH (ref 70–99)
Potassium: 3.8 mmol/L (ref 3.5–5.1)
Sodium: 136 mmol/L (ref 135–145)
Total Bilirubin: 0.4 mg/dL (ref 0.3–1.2)
Total Protein: 7.4 g/dL (ref 6.5–8.1)

## 2021-05-24 LAB — CBC
HCT: 42.1 % (ref 36.0–46.0)
Hemoglobin: 14.1 g/dL (ref 12.0–15.0)
MCH: 27.3 pg (ref 26.0–34.0)
MCHC: 33.5 g/dL (ref 30.0–36.0)
MCV: 81.4 fL (ref 80.0–100.0)
Platelets: 344 10*3/uL (ref 150–400)
RBC: 5.17 MIL/uL — ABNORMAL HIGH (ref 3.87–5.11)
RDW: 14.3 % (ref 11.5–15.5)
WBC: 7.4 10*3/uL (ref 4.0–10.5)
nRBC: 0 % (ref 0.0–0.2)

## 2021-05-24 LAB — PREGNANCY, URINE: Preg Test, Ur: NEGATIVE

## 2021-05-24 LAB — LIPASE, BLOOD: Lipase: 30 U/L (ref 11–51)

## 2021-05-24 MED ORDER — HYDROMORPHONE HCL 1 MG/ML IJ SOLN
1.0000 mg | Freq: Once | INTRAMUSCULAR | Status: AC
  Administered 2021-05-24: 1 mg via INTRAVENOUS
  Filled 2021-05-24: qty 1

## 2021-05-24 MED ORDER — ONDANSETRON HCL 4 MG/2ML IJ SOLN
4.0000 mg | Freq: Once | INTRAMUSCULAR | Status: AC
  Administered 2021-05-24: 4 mg via INTRAVENOUS
  Filled 2021-05-24: qty 2

## 2021-05-24 MED ORDER — HYDROCODONE-ACETAMINOPHEN 5-325 MG PO TABS: 1.0000 | ORAL_TABLET | ORAL | 0 refills | Status: DC | PRN

## 2021-05-24 MED ORDER — SODIUM CHLORIDE 0.9 % IV BOLUS
1000.0000 mL | Freq: Once | INTRAVENOUS | Status: AC
  Administered 2021-05-24: 1000 mL via INTRAVENOUS

## 2021-05-24 NOTE — ED Notes (Signed)
Pt stated that her mother was on the way to pick her up. [employer] officer also informed about Pt.

## 2021-05-24 NOTE — Discharge Instructions (Addendum)
If you develop worsening, continued, or recurrent abdominal pain, uncontrolled vomiting, fever, chest or back pain, or any other new/concerning symptoms then return to the ER for evaluation.  

## 2021-05-24 NOTE — ED Provider Notes (Signed)
[facility] EMERGENCY DEPT Provider Note   CSN: [id number] Arrival date & time: 05/24/21  9892     History Chief Complaint  Patient presents with   Abdominal Pain    Casey Manning is a 25 y.o. female.  HPI 25 year old female presents with acute upper abdominal pain and vomiting.  Started around midnight all of a sudden.  The pain is severe and sharp.  It feels like her lower chest/upper abdomen.  She has not had a fever but has felt hot.  She is had 2 episodes of emesis.  Also 2 episodes of diarrhea.  No blood in either.  No prior abdominal issues or surgeries.  History reviewed. No pertinent past medical history.  There are no problems to display for this patient.   History reviewed. No pertinent surgical history.   OB History   No obstetric history on file.     No family history on file.  Social History   Tobacco Use   Smoking status: Never   Smokeless tobacco: Never  Substance Use Topics   Alcohol use: Never   Drug use: Never    Home Medications Prior to Admission medications   Medication Sig Start Date End Date Taking? Authorizing Provider  HYDROcodone-acetaminophen (NORCO) 5-325 MG tablet Take 1 tablet by mouth every 4 (four) hours as needed for severe pain. 05/24/21  Yes Pricilla Loveless, MD    Allergies    Patient has no known allergies.  Review of Systems   Review of Systems  Constitutional:  Negative for chills and fever.  Cardiovascular:  Positive for chest pain.  Gastrointestinal:  Positive for abdominal pain, diarrhea, nausea and vomiting.  All other systems reviewed and are negative.  Physical Exam Updated Vital Signs BP 118/68 (BP Location: Left Arm)   Pulse (!) 57   Temp 98.4 F (36.9 C) (Oral)   Resp 14   Ht 5\' 2"  (1.575 m)   Wt 108.9 kg   SpO2 98%   BMI 43.90 kg/m   Physical Exam Vitals and nursing note reviewed.  Constitutional:      Appearance: She is well-developed. She is not ill-appearing or diaphoretic.   HENT:     Head: Normocephalic and atraumatic.     Right Ear: External ear normal.     Left Ear: External ear normal.     Nose: Nose normal.  Eyes:     General:        Right eye: No discharge.        Left eye: No discharge.  Cardiovascular:     Rate and Rhythm: Normal rate and regular rhythm.     Heart sounds: Normal heart sounds.  Pulmonary:     Effort: Pulmonary effort is normal.     Breath sounds: Normal breath sounds.  Abdominal:     Palpations: Abdomen is soft.     Tenderness: There is abdominal tenderness in the right upper quadrant.  Skin:    General: Skin is warm and dry.  Neurological:     Mental Status: She is alert.  Psychiatric:        Mood and Affect: Mood is not anxious.    ED Results / Procedures / Treatments   Labs (all labs ordered are listed, but only abnormal results are displayed) Labs Reviewed  COMPREHENSIVE METABOLIC PANEL - Abnormal; Notable for the following components:      Result Value   CO2 21 (*)    Glucose, Bld 118 (*)    Creatinine, Ser 0.42 (*)  All other components within normal limits  CBC - Abnormal; Notable for the following components:   RBC 5.17 (*)    All other components within normal limits  LIPASE, BLOOD  PREGNANCY, URINE    EKG EKG Interpretation  Date/Time:  Thursday May 24 2021 08:35:12 EDT Ventricular Rate:  66 PR Interval:  144 QRS Duration: 80 QT Interval:  395 QTC Calculation: 414 R Axis:   31 Text Interpretation: Sinus arrhythmia Low voltage, precordial leads Baseline wander in lead(s) V6 Confirmed by Pricilla Loveless [phone]) on 05/24/2021 8:40:35 AM  Radiology DG Chest Portable 1 View  Result Date: 05/24/2021 CLINICAL DATA:  Chest/abdominal pain EXAM: PORTABLE CHEST 1 VIEW COMPARISON:  None. FINDINGS: The heart size and mediastinal contours are within normal limits. Both lungs are clear. No pleural effusion or pneumothorax. The visualized skeletal structures are unremarkable. IMPRESSION: No acute process  in the chest. Electronically Signed   By: Guadlupe Spanish M.D.   On: 05/24/2021 08:55   US Abdomen Limited RUQ (LIVER/GB)  Result Date: 05/24/2021 CLINICAL DATA:  25 year old female with epigastric pain, nausea vomiting diarrhea since last night. EXAM: ULTRASOUND ABDOMEN LIMITED RIGHT UPPER QUADRANT COMPARISON:  None. FINDINGS: Gallbladder: Several shadowing gallstones are identified, up to 15 mm gallstone (image 3). Gallbladder wall thickness remains normal. No pericholecystic fluid. No sonographic Murphy sign elicited. Common bile duct: Diameter: 5 mm, normal. Liver: Echogenic liver (image 30). No discrete liver lesion. No intrahepatic biliary ductal dilatation. Portal vein is patent on color Doppler imaging with normal direction of blood flow towards the liver. Other: Negative visible right kidney. IMPRESSION: 1. Cholelithiasis without evidence of acute cholecystitis or bile duct obstruction. 2. Fatty liver disease. Electronically Signed   By: Odessa Fleming M.D.   On: 05/24/2021 09:20    Procedures Procedures   Medications Ordered in ED Medications  sodium chloride 0.9 % bolus 1,000 mL (0 mLs Intravenous Stopped 05/24/21 1040)  HYDROmorphone (DILAUDID) injection 1 mg (1 mg Intravenous Given 05/24/21 0832)  ondansetron (ZOFRAN) injection 4 mg (4 mg Intravenous Given 05/24/21 0831)    ED Course  I have reviewed the triage vital signs and the nursing notes.  Pertinent labs & imaging results that were available during my care of the patient were reviewed by me and considered in my medical decision making (see chart for details).    MDM Rules/Calculators/A&P                           Patient's work-up shows a sizable gallstone but no cholecystitis.  I think this is the cause of her acute symptoms.  White count is normal and her pain has essentially resolved after a dose of pain meds.  She is feeling much better.  She will probably need an outpatient cholecystectomy but does not appear to need emergent  surgery or admission.  We discussed return precautions and I will give her Norco for breakthrough pain but she otherwise appears stable for discharge home with return precautions. Final Clinical Impression(s) / ED Diagnoses Final diagnoses:  Abdominal pain, RUQ (right upper quadrant)  Calculus of gallbladder without cholecystitis without obstruction    Rx / DC Orders ED Discharge Orders          Ordered    HYDROcodone-acetaminophen (NORCO) 5-325 MG tablet  Every 4 hours PRN        05/24/21 1101             Pricilla Loveless, MD 05/24/21 1102

## 2021-05-24 NOTE — ED Triage Notes (Signed)
During triage pt asked about length of wait. When unable to provide a time pt states "I'm just going to leave and go to another one" Pt to sign AMA form

## 2021-05-24 NOTE — ED Triage Notes (Signed)
Pt c/o LUQ abdominal pain that started last night. Pt states she had one episode of emesis PTA along with diarrhea. LMP approximately at the end of July

## 2021-05-24 NOTE — ED Triage Notes (Signed)
Pt poor historian with information, epigastric pain with vomiting and diarrhea since last night. Does not recall what she ate prior to event.

## 2021-05-24 NOTE — ED Notes (Signed)
Pt is alert and oriented. She walks un assisted with a steady gait. Pt was instructed to wait 1 hour before draining . Pt agreed to wait and was in process of calling her mother to come get her. Staff in lobby also instructed of same related to not driving. Pt was given ginger ale to drink while she waited.

## 2021-05-25 ENCOUNTER — Encounter (HOSPITAL_COMMUNITY): Payer: Self-pay

## 2021-06-04 ENCOUNTER — Emergency Department (HOSPITAL_BASED_OUTPATIENT_CLINIC_OR_DEPARTMENT_OTHER)
Admission: EM | Admit: 2021-06-04 | Discharge: 2021-06-04 | Disposition: A | Discharge status: Home / Self Care | Payer: Medicaid Other | Attending: Emergency Medicine | Admitting: Emergency Medicine

## 2021-06-04 ENCOUNTER — Other Ambulatory Visit: Payer: Self-pay

## 2021-06-04 ENCOUNTER — Encounter (HOSPITAL_BASED_OUTPATIENT_CLINIC_OR_DEPARTMENT_OTHER): Payer: Self-pay

## 2021-06-04 DIAGNOSIS — K8050 Calculus of bile duct without cholangitis or cholecystitis without obstruction: Secondary | ICD-10-CM

## 2021-06-04 DIAGNOSIS — R1013 Epigastric pain: Secondary | ICD-10-CM | POA: Diagnosis present

## 2021-06-04 DIAGNOSIS — K8020 Calculus of gallbladder without cholecystitis without obstruction: Secondary | ICD-10-CM | POA: Insufficient documentation

## 2021-06-04 LAB — CBC WITH DIFFERENTIAL/PLATELET
Abs Immature Granulocytes: 0.01 10*3/uL (ref 0.00–0.07)
Basophils Absolute: 0 10*3/uL (ref 0.0–0.1)
Basophils Relative: 0 %
Eosinophils Absolute: 0 10*3/uL (ref 0.0–0.5)
Eosinophils Relative: 1 %
HCT: 38.4 % (ref 36.0–46.0)
Hemoglobin: 12.6 g/dL (ref 12.0–15.0)
Immature Granulocytes: 0 %
Lymphocytes Relative: 49 %
Lymphs Abs: 2.4 10*3/uL (ref 0.7–4.0)
MCH: 27.2 pg (ref 26.0–34.0)
MCHC: 32.8 g/dL (ref 30.0–36.0)
MCV: 82.8 fL (ref 80.0–100.0)
Monocytes Absolute: 0.4 10*3/uL (ref 0.1–1.0)
Monocytes Relative: 7 %
Neutro Abs: 2.2 10*3/uL (ref 1.7–7.7)
Neutrophils Relative %: 43 %
Platelets: 308 10*3/uL (ref 150–400)
RBC: 4.64 MIL/uL (ref 3.87–5.11)
RDW: 14.3 % (ref 11.5–15.5)
WBC: 5 10*3/uL (ref 4.0–10.5)
nRBC: 0 % (ref 0.0–0.2)

## 2021-06-04 LAB — URINALYSIS, MICROSCOPIC (REFLEX)

## 2021-06-04 LAB — URINALYSIS, ROUTINE W REFLEX MICROSCOPIC
Bilirubin Urine: NEGATIVE
Glucose, UA: NEGATIVE mg/dL
Hgb urine dipstick: NEGATIVE
Ketones, ur: NEGATIVE mg/dL
Nitrite: NEGATIVE
Protein, ur: NEGATIVE mg/dL
Specific Gravity, Urine: 1.016 (ref 1.005–1.030)
pH: 7 (ref 5.0–8.0)

## 2021-06-04 LAB — COMPREHENSIVE METABOLIC PANEL
ALT: 22 U/L (ref 0–44)
AST: 14 U/L — ABNORMAL LOW (ref 15–41)
Albumin: 4.2 g/dL (ref 3.5–5.0)
Alkaline Phosphatase: 70 U/L (ref 38–126)
Anion gap: 8 (ref 5–15)
BUN: 8 mg/dL (ref 6–20)
CO2: 27 mmol/L (ref 22–32)
Calcium: 9.2 mg/dL (ref 8.9–10.3)
Chloride: 104 mmol/L (ref 98–111)
Creatinine, Ser: 0.57 mg/dL (ref 0.44–1.00)
GFR, Estimated: >60 mL/min (ref >60)
Glucose, Bld: 93 mg/dL (ref 70–99)
Potassium: 3.5 mmol/L (ref 3.5–5.1)
Sodium: 139 mmol/L (ref 135–145)
Total Bilirubin: 0.4 mg/dL (ref 0.3–1.2)
Total Protein: 7 g/dL (ref 6.5–8.1)

## 2021-06-04 LAB — LIPASE, BLOOD: Lipase: 53 U/L — ABNORMAL HIGH (ref 11–51)

## 2021-06-04 LAB — PREGNANCY, URINE: Preg Test, Ur: NEGATIVE

## 2021-06-04 MED ORDER — MORPHINE SULFATE (PF) 4 MG/ML IV SOLN
4.0000 mg | Freq: Once | INTRAVENOUS | Status: AC
  Administered 2021-06-04: 4 mg via INTRAVENOUS
  Filled 2021-06-04: qty 1

## 2021-06-04 MED ORDER — ONDANSETRON HCL 4 MG/2ML IJ SOLN
4.0000 mg | Freq: Once | INTRAMUSCULAR | Status: AC
  Administered 2021-06-04: 4 mg via INTRAVENOUS
  Filled 2021-06-04: qty 2

## 2021-06-04 MED ORDER — DROPERIDOL 2.5 MG/ML IJ SOLN
2.5000 mg | Freq: Once | INTRAMUSCULAR | Status: AC
  Administered 2021-06-04: 2.5 mg via INTRAVENOUS
  Filled 2021-06-04: qty 2

## 2021-06-04 NOTE — ED Notes (Signed)
Pt given crackers and water for PO challenge

## 2021-06-04 NOTE — ED Provider Notes (Signed)
[facility] EMERGENCY DEPT Provider Note   CSN: [id number] Arrival date & time: 06/04/21  0113     History Chief Complaint  Patient presents with   Abdominal Pain    Casey Manning is a 25 y.o. female.  HPI     This is a 25 year old female with recent diagnosis of cholelithiasis who presents with ongoing abdominal pain.  Patient reports sharp stabbing epigastric and right upper quadrant abdominal pain.  She associates this with her gallstones.  She has followed up with general surgery and reports that she should be scheduled for surgery in the next 7 to 10 days.  She has had nausea without vomiting.  She rates her pain at 10 out of 10.  She has been taking her home pain medication with no relief.  No diarrhea.  No fevers.  No urinary symptoms.  She does not believe herself to be pregnant.  Past Medical History:  Diagnosis Date   Seasonal allergies     Patient Active Problem List   Diagnosis Date Noted   Marijuana use 07/31/2017   Migraine without aura and without status migrainosus, not intractable 06/13/2016    Past Surgical History:  Procedure Laterality Date   NO PAST SURGERIES       OB History     Gravida  1   Para  1   Term  1   Preterm  0   AB  0   Living  1      SAB  0   IAB  0   Ectopic  0   Multiple      Live Births  1           Family History  Family history unknown: Yes    Social History   Tobacco Use   Smoking status: Never   Smokeless tobacco: Never  Substance Use Topics   Alcohol use: Never   Drug use: Never    Types: Marijuana    Comment: none since delivery    Home Medications Prior to Admission medications   Medication Sig Start Date End Date Taking? Authorizing Provider  cetirizine-pseudoephedrine (ZYRTEC-D) 5-120 MG tablet Take 1 tablet by mouth daily. 11/18/17   Georgetta Haber, NP  chlorzoxazone (PARAFON) 500 MG tablet One tablet as needed up to four times daily for headache 02/09/21    [provider]  HYDROcodone-acetaminophen (NORCO) 5-325 MG tablet Take 1 tablet by mouth every 4 (four) hours as needed for severe pain. 05/24/21   Pricilla Loveless, MD  ibuprofen (ADVIL,MOTRIN) 600 MG tablet Take 1 tablet (600 mg total) by mouth every 6 (six) hours. 09/11/17   Degele, Kandra Nicolas, MD  rizatriptan (MAXALT) 10 MG tablet Take by mouth. 02/09/21   [provider]  topiramate (TOPAMAX) 25 MG tablet Take 100 mg by mouth at bedtime. 02/09/21   [provider]    Allergies    Patient has no known allergies.  Review of Systems   Review of Systems  Constitutional:  Negative for fever.  Respiratory:  Negative for shortness of breath.   Cardiovascular:  Negative for chest pain.  Gastrointestinal:  Positive for abdominal pain. Negative for diarrhea, nausea and vomiting.  Genitourinary:  Negative for dysuria.  All other systems reviewed and are negative.  Physical Exam Updated Vital Signs BP 108/61   Pulse (!) 53   Temp 98.1 F (36.7 C) (Oral)   Resp 17   Ht 1.575 m (5\' 2" )   Wt 107.5 kg  SpO2 99%   BMI 43.35 kg/m   Physical Exam Vitals and nursing note reviewed.  Constitutional:      Appearance: She is well-developed. She is obese. She is not ill-appearing.  HENT:     Head: Normocephalic and atraumatic.  Eyes:     Pupils: Pupils are equal, round, and reactive to light.  Cardiovascular:     Rate and Rhythm: Normal rate and regular rhythm.     Heart sounds: Normal heart sounds.  Pulmonary:     Effort: Pulmonary effort is normal. No respiratory distress.     Breath sounds: No wheezing.  Abdominal:     General: Bowel sounds are normal.     Palpations: Abdomen is soft.     Tenderness: There is abdominal tenderness in the right upper quadrant. There is no guarding or rebound. Positive signs include Murphy's sign.  Musculoskeletal:     Cervical back: Neck supple.  Skin:    General: Skin is warm and dry.  Neurological:     Mental Status: She is  alert and oriented to person, place, and time.  Psychiatric:        Mood and Affect: Mood normal.    ED Results / Procedures / Treatments   Labs (all labs ordered are listed, but only abnormal results are displayed) Labs Reviewed  COMPREHENSIVE METABOLIC PANEL - Abnormal; Notable for the following components:      Result Value   AST 14 (*)    All other components within normal limits  LIPASE, BLOOD - Abnormal; Notable for the following components:   Lipase 53 (*)    All other components within normal limits  URINALYSIS, ROUTINE W REFLEX MICROSCOPIC - Abnormal; Notable for the following components:   APPearance HAZY (*)    Leukocytes,Ua MODERATE (*)    All other components within normal limits  URINALYSIS, MICROSCOPIC (REFLEX) - Abnormal; Notable for the following components:   Bacteria, UA RARE (*)    All other components within normal limits  CBC WITH DIFFERENTIAL/PLATELET  PREGNANCY, URINE    EKG None  Radiology No results found.  Procedures Procedures   Medications Ordered in ED Medications  morphine 4 MG/ML injection 4 mg (4 mg Intravenous Given 06/04/21 0156)  ondansetron (ZOFRAN) injection 4 mg (4 mg Intravenous Given 06/04/21 0156)  droperidol (INAPSINE) 2.5 MG/ML injection 2.5 mg (2.5 mg Intravenous Given 06/04/21 0239)    ED Course  I have reviewed the triage vital signs and the nursing notes.  Pertinent labs & imaging results that were available during my care of the patient were reviewed by me and considered in my medical decision making (see chart for details).    MDM Rules/Calculators/A&P                           Patient presents with abdominal pain.  Reports pain is consistent with prior biliary colic.  She is nontoxic and vital signs are reassuring.  She is afebrile.  She has right upper quadrant tenderness to palpation but no signs of peritonitis.  Considerations include but not limited to, ongoing biliary colic, pancreatitis, gastritis.  Less likely  appendicitis.  Patient given pain and nausea medication.  Labs obtained.  No significant leukocytosis.  LFTs are reassuring.  Lipase is very minimally elevated at 53.  Patient with significant improvement of symptoms, in the emergency department.  Doubt pancreatitis or choledocholithiasis at this time.  Given improvement of symptoms, do not feel repeat imaging is  necessary.  Highly suspect ongoing biliary colic.  Recommend general surgery follow-up.  After history, exam, and medical workup I feel the patient has been appropriately medically screened and is safe for discharge home. Pertinent diagnoses were discussed with the patient. Patient was given return precautions.  Final Clinical Impression(s) / ED Diagnoses Final diagnoses:  Biliary colic    Rx / DC Orders ED Discharge Orders     None        Emmalia Heyboer, Mayer Masker, MD 06/04/21 [phone]

## 2021-06-04 NOTE — ED Notes (Signed)
Pt c/o pain still and that the morphine has not helped. MD notified.

## 2021-06-04 NOTE — ED Notes (Signed)
Pt verbalizes understanding of discharge instructions. Opportunity for questioning and answers were provided. Armand removed by staff, pt discharged from ED to home. Educated to f/u with surgeon to have gallbladder removed.

## 2021-06-04 NOTE — Discharge Instructions (Addendum)
You are seen today for ongoing pain.  This is likely related to your known gallstones.  Continue to avoid foods that provoke symptoms.  Take medications as prescribed.  Follow-up with general surgery.

## 2021-06-04 NOTE — ED Triage Notes (Signed)
Pt states, "My gallstones are hurting". Pt c/o RUQ pain with belching. Denies N/V. Pt was prescribed hydrocodone and took two of them a hour ago with no relief.

## 2021-06-05 NOTE — Progress Notes (Addendum)
COVID swab appointment: N/a  COVID Vaccine Completed: yes x2 Date COVID Vaccine completed: Has received booster: COVID vaccine manufacturer:  Moderna     Date of COVID positive in last 90 days: No  PCP - [facility] Cardiologist - N/a  Chest x-ray - 05/24/21 Epic EKG - 05/24/21 Epic Stress Test - N/a ECHO - N/a Cardiac Cath - N/a Pacemaker/ICD device last checked: N/a Spinal Cord Stimulator: N/a  Sleep Study - N/a CPAP -   Fasting Blood Sugar - N/a Checks Blood Sugar _____ times a day  Blood Thinner Instructions: N/a Aspirin Instructions: Last Dose:  Activity level: Can go up a flight of stairs and perform activities of daily living without stopping and without symptoms of chest pain or shortness of breath.    Anesthesia review:   Patient denies shortness of breath, fever, cough and chest pain at PAT appointment   Patient verbalized understanding of instructions that were given to them at the PAT appointment. Patient was also instructed that they will need to review over the PAT instructions again at home before surgery.

## 2021-06-05 NOTE — Progress Notes (Signed)
Please place orders for PAT appointment scheduled 06/06/21.

## 2021-06-05 NOTE — Patient Instructions (Addendum)
DUE TO COVID-19 ONLY ONE VISITOR IS ALLOWED TO COME WITH YOU AND STAY IN THE WAITING ROOM ONLY DURING PRE OP AND PROCEDURE.   **NO VISITORS ARE ALLOWED IN THE SHORT STAY AREA OR RECOVERY ROOM!!**       Your procedure is scheduled on: 06/12/21   Report to [facility] Main  Entrance    Report to admitting at 12:45 PM   Call this number if you have problems the morning of surgery [phone]   Do not eat food :After Midnight.   May have liquids until 12:00 PM day of surgery  CLEAR LIQUID DIET  Foods Allowed                                                                     Foods Excluded  Water, Black Coffee and tea (NO creamer or milk)          liquids that you cannot  Plain Jell-O in any flavor  (No red)                                    see through such as: Fruit ices (not with fruit pulp)                                           milk, soups, orange juice              Iced Popsicles (No red)                                               All solid food                                   Apple juices Sports drinks like Gatorade (No red) Lightly seasoned clear broth or consume(fat free) Sugar, honey syrup      The day of surgery:  Drink ONE (1) Pre-Surgery Clear Ensure by 12:00 pm the morning of surgery. Drink in one sitting. Do not sip.  This drink was given to you during your hospital  pre-op appointment visit. Nothing else to drink after completing the  Pre-Surgery Clear Ensure.          If you have questions, please contact your surgeon's office.     Oral Hygiene is also important to reduce your risk of infection.                                    Remember - BRUSH YOUR TEETH THE MORNING OF SURGERY WITH YOUR REGULAR TOOTHPASTE   Take these medicines the morning of surgery with A SIP OF WATER: None  You may not have any metal on your body including hair pins, jewelry, and body piercing             Do not wear make-up, lotions,  powders, perfumes, or deodorant  Do not wear nail polish including gel and S&S, artificial/acrylic nails, or any other type of covering on natural nails including finger and toenails. If you have artificial nails, gel coating, etc. that needs to be removed by a nail salon please have this removed prior to surgery or surgery may need to be canceled/ delayed if the surgeon/ anesthesia feels like they are unable to be safely monitored.   Do not shave  48 hours prior to surgery.    Do not bring valuables to the hospital. [facility] IS NOT             RESPONSIBLE   FOR VALUABLES.   Patients discharged the day of surgery will not be allowed to drive home.   Please read over the following fact sheets you were given: IF YOU HAVE QUESTIONS ABOUT YOUR PRE OP INSTRUCTIONS PLEASE CALL [phone]-Analeise Mccleery   [facility] - Preparing for Surgery Before surgery, you can play an important role.  Because skin is not sterile, your skin needs to be as free of germs as possible.  You can reduce the number of germs on your skin by washing with CHG (chlorahexidine gluconate) soap before surgery.  CHG is an antiseptic cleaner which kills germs and bonds with the skin to continue killing germs even after washing. Please DO NOT use if you have an allergy to CHG or antibacterial soaps.  If your skin becomes reddened/irritated stop using the CHG and inform your nurse when you arrive at Short Stay. Do not shave (including legs and underarms) for at least 48 hours prior to the first CHG shower.  You may shave your face/neck.  Please follow these instructions carefully:  1.  Shower with CHG Soap the night before surgery and the  morning of surgery.  2.  If you choose to wash your hair, wash your hair first as usual with your normal  shampoo.  3.  After you shampoo, rinse your hair and body thoroughly to remove the shampoo.                             4.  Use CHG as you would any other liquid soap.  You can apply chg directly  to the skin and wash.  Gently with a scrungie or clean washcloth.  5.  Apply the CHG Soap to your body ONLY FROM THE NECK DOWN.   Do   not use on face/ open                           Wound or open sores. Avoid contact with eyes, ears mouth and   genitals (private parts).                       Wash face,  Genitals (private parts) with your normal soap.             6.  Wash thoroughly, paying special attention to the area where your    surgery  will be performed.  7.  Thoroughly rinse your body with warm water from the neck down.  8.  DO NOT shower/wash with your normal soap after using and rinsing off  the CHG Soap.                9.  Pat yourself dry with a clean towel.            10.  Wear clean pajamas.            11.  Place clean sheets on your bed the night of your first shower and do not  sleep with pets. Day of Surgery : Do not apply any lotions/deodorants the morning of surgery.  Please wear clean clothes to the hospital/surgery center.  FAILURE TO FOLLOW THESE INSTRUCTIONS MAY RESULT IN THE CANCELLATION OF YOUR SURGERY  PATIENT SIGNATURE_________________________________  NURSE SIGNATURE__________________________________  ________________________________________________________________________

## 2021-06-06 ENCOUNTER — Ambulatory Visit: Payer: Self-pay | Admitting: General Surgery

## 2021-06-06 ENCOUNTER — Encounter (HOSPITAL_COMMUNITY): Payer: Self-pay

## 2021-06-06 ENCOUNTER — Encounter (HOSPITAL_COMMUNITY)
Admission: RE | Admit: 2021-06-06 | Discharge: 2021-06-06 | Disposition: A | Discharge status: Home / Self Care | Payer: Medicaid Other | Source: Ambulatory Visit | Attending: General Surgery | Admitting: General Surgery

## 2021-06-06 ENCOUNTER — Other Ambulatory Visit: Payer: Self-pay

## 2021-06-06 DIAGNOSIS — Z01818 Encounter for other preprocedural examination: Secondary | ICD-10-CM | POA: Insufficient documentation

## 2021-06-06 HISTORY — DX: Gastro-esophageal reflux disease without esophagitis: K21.9

## 2021-06-06 NOTE — Progress Notes (Signed)
Attempted to obtain medical history via telephone, unable to reach at this time. I left a voicemail to return pre surgical testing department's phone call.  

## 2021-06-12 ENCOUNTER — Ambulatory Visit (HOSPITAL_COMMUNITY): Payer: Medicaid Other | Admitting: Anesthesiology

## 2021-06-12 ENCOUNTER — Encounter (HOSPITAL_COMMUNITY): Payer: Self-pay | Admitting: General Surgery

## 2021-06-12 ENCOUNTER — Ambulatory Visit (HOSPITAL_COMMUNITY): Payer: Medicaid Other

## 2021-06-12 ENCOUNTER — Encounter (HOSPITAL_COMMUNITY)
Admission: RE | Disposition: A | Discharge status: Home / Self Care | Payer: Self-pay | Source: Ambulatory Visit | Attending: General Surgery

## 2021-06-12 ENCOUNTER — Ambulatory Visit (HOSPITAL_COMMUNITY)
Admission: RE | Admit: 2021-06-12 | Discharge: 2021-06-12 | Disposition: A | Discharge status: Home / Self Care | Payer: Medicaid Other | Source: Ambulatory Visit | Attending: General Surgery | Admitting: General Surgery

## 2021-06-12 DIAGNOSIS — Z888 Allergy status to other drugs, medicaments and biological substances status: Secondary | ICD-10-CM | POA: Insufficient documentation

## 2021-06-12 DIAGNOSIS — Z79899 Other long term (current) drug therapy: Secondary | ICD-10-CM | POA: Insufficient documentation

## 2021-06-12 DIAGNOSIS — Z6841 Body Mass Index (BMI) 40.0 and over, adult: Secondary | ICD-10-CM | POA: Insufficient documentation

## 2021-06-12 DIAGNOSIS — K8010 Calculus of gallbladder with chronic cholecystitis without obstruction: Secondary | ICD-10-CM | POA: Insufficient documentation

## 2021-06-12 DIAGNOSIS — K760 Fatty (change of) liver, not elsewhere classified: Secondary | ICD-10-CM | POA: Diagnosis not present

## 2021-06-12 DIAGNOSIS — K8020 Calculus of gallbladder without cholecystitis without obstruction: Secondary | ICD-10-CM

## 2021-06-12 HISTORY — PX: CHOLECYSTECTOMY: SHX55

## 2021-06-12 LAB — PREGNANCY, URINE: Preg Test, Ur: NEGATIVE

## 2021-06-12 SURGERY — LAPAROSCOPIC CHOLECYSTECTOMY WITH INTRAOPERATIVE CHOLANGIOGRAM
Anesthesia: General

## 2021-06-12 MED ORDER — SODIUM CHLORIDE 0.9 % IV SOLN
2.0000 g | INTRAVENOUS | Status: AC
  Administered 2021-06-12: 2 g via INTRAVENOUS
  Filled 2021-06-12: qty 2

## 2021-06-12 MED ORDER — PROPOFOL 10 MG/ML IV BOLUS
INTRAVENOUS | Status: DC | PRN
  Administered 2021-06-12: 200 mg via INTRAVENOUS

## 2021-06-12 MED ORDER — PROMETHAZINE HCL 25 MG/ML IJ SOLN: 6.2500 mg | INTRAMUSCULAR | Status: DC | PRN

## 2021-06-12 MED ORDER — LIDOCAINE 2% (20 MG/ML) 5 ML SYRINGE
INTRAMUSCULAR | Status: DC | PRN
  Administered 2021-06-12: 100 mg via INTRAVENOUS

## 2021-06-12 MED ORDER — ACETAMINOPHEN 500 MG PO TABS: 1000.0000 mg | ORAL_TABLET | Freq: Three times a day (TID) | ORAL | 0 refills | Status: AC

## 2021-06-12 MED ORDER — BUPIVACAINE-EPINEPHRINE (PF) 0.25% -1:200000 IJ SOLN
INTRAMUSCULAR | Status: AC
  Filled 2021-06-12: qty 30

## 2021-06-12 MED ORDER — FENTANYL CITRATE (PF) 100 MCG/2ML IJ SOLN
INTRAMUSCULAR | Status: DC | PRN
  Administered 2021-06-12 (×3): 50 ug via INTRAVENOUS
  Administered 2021-06-12: 100 ug via INTRAVENOUS

## 2021-06-12 MED ORDER — PROPOFOL 10 MG/ML IV BOLUS
INTRAVENOUS | Status: AC
  Filled 2021-06-12: qty 20

## 2021-06-12 MED ORDER — SUGAMMADEX SODIUM 200 MG/2ML IV SOLN
INTRAVENOUS | Status: DC | PRN
  Administered 2021-06-12: 200 mg via INTRAVENOUS

## 2021-06-12 MED ORDER — SODIUM CHLORIDE 0.9 % IV SOLN
INTRAVENOUS | Status: DC | PRN
  Administered 2021-06-12: 20 mL

## 2021-06-12 MED ORDER — OXYCODONE HCL 5 MG PO TABS: 5.0000 mg | ORAL_TABLET | Freq: Once | ORAL | Status: DC | PRN

## 2021-06-12 MED ORDER — IBUPROFEN 600 MG PO TABS: 600.0000 mg | ORAL_TABLET | Freq: Three times a day (TID) | ORAL | 0 refills | Status: AC | PRN

## 2021-06-12 MED ORDER — AMISULPRIDE (ANTIEMETIC) 5 MG/2ML IV SOLN
INTRAVENOUS | Status: AC
  Administered 2021-06-12: 10 mg via INTRAVENOUS
  Filled 2021-06-12: qty 4

## 2021-06-12 MED ORDER — CHLORHEXIDINE GLUCONATE CLOTH 2 % EX PADS: 6.0000 | MEDICATED_PAD | Freq: Once | CUTANEOUS | Status: DC

## 2021-06-12 MED ORDER — MIDAZOLAM HCL 5 MG/5ML IJ SOLN
INTRAMUSCULAR | Status: DC | PRN
  Administered 2021-06-12: 2 mg via INTRAVENOUS

## 2021-06-12 MED ORDER — ACETAMINOPHEN 500 MG PO TABS
1000.0000 mg | ORAL_TABLET | ORAL | Status: AC
  Administered 2021-06-12: 1000 mg via ORAL
  Filled 2021-06-12: qty 2

## 2021-06-12 MED ORDER — KETOROLAC TROMETHAMINE 30 MG/ML IJ SOLN
INTRAMUSCULAR | Status: DC | PRN
  Administered 2021-06-12: 30 mg via INTRAVENOUS

## 2021-06-12 MED ORDER — INDOCYANINE GREEN 25 MG IV SOLR: 25.0000 mg | Freq: Once | INTRAVENOUS | Status: DC

## 2021-06-12 MED ORDER — DEXAMETHASONE SODIUM PHOSPHATE 10 MG/ML IJ SOLN
INTRAMUSCULAR | Status: DC | PRN
  Administered 2021-06-12: 10 mg via INTRAVENOUS

## 2021-06-12 MED ORDER — FENTANYL CITRATE PF 50 MCG/ML IJ SOSY
PREFILLED_SYRINGE | INTRAMUSCULAR | Status: AC
  Administered 2021-06-12: 25 ug via INTRAVENOUS
  Filled 2021-06-12: qty 1

## 2021-06-12 MED ORDER — PROPOFOL 500 MG/50ML IV EMUL
INTRAVENOUS | Status: DC | PRN
  Administered 2021-06-12: 25 ug/kg/min via INTRAVENOUS

## 2021-06-12 MED ORDER — PHENYLEPHRINE 40 MCG/ML (10ML) SYRINGE FOR IV PUSH (FOR BLOOD PRESSURE SUPPORT)
PREFILLED_SYRINGE | INTRAVENOUS | Status: DC | PRN
  Administered 2021-06-12: 100 ug via INTRAVENOUS

## 2021-06-12 MED ORDER — FENTANYL CITRATE (PF) 250 MCG/5ML IJ SOLN
INTRAMUSCULAR | Status: AC
  Filled 2021-06-12: qty 5

## 2021-06-12 MED ORDER — AMISULPRIDE (ANTIEMETIC) 5 MG/2ML IV SOLN: 10.0000 mg | Freq: Once | INTRAVENOUS | Status: AC | PRN

## 2021-06-12 MED ORDER — BUPIVACAINE-EPINEPHRINE 0.25% -1:200000 IJ SOLN
INTRAMUSCULAR | Status: DC | PRN
  Administered 2021-06-12: 30 mL

## 2021-06-12 MED ORDER — ROCURONIUM BROMIDE 10 MG/ML (PF) SYRINGE
PREFILLED_SYRINGE | INTRAVENOUS | Status: DC | PRN
  Administered 2021-06-12: 20 mg via INTRAVENOUS
  Administered 2021-06-12: 40 mg via INTRAVENOUS
  Administered 2021-06-12: 20 mg via INTRAVENOUS

## 2021-06-12 MED ORDER — OXYCODONE HCL 5 MG PO TABS
ORAL_TABLET | ORAL | Status: AC
  Filled 2021-06-12: qty 1

## 2021-06-12 MED ORDER — FENTANYL CITRATE PF 50 MCG/ML IJ SOSY
25.0000 ug | PREFILLED_SYRINGE | INTRAMUSCULAR | Status: DC | PRN
  Administered 2021-06-12: 25 ug via INTRAVENOUS

## 2021-06-12 MED ORDER — CHLORHEXIDINE GLUCONATE 0.12 % MT SOLN
15.0000 mL | Freq: Once | OROMUCOSAL | Status: AC
  Administered 2021-06-12: 15 mL via OROMUCOSAL

## 2021-06-12 MED ORDER — SCOPOLAMINE 1 MG/3DAYS TD PT72
1.0000 | MEDICATED_PATCH | TRANSDERMAL | Status: DC
  Administered 2021-06-12: 1.5 mg via TRANSDERMAL
  Filled 2021-06-12: qty 1

## 2021-06-12 MED ORDER — KETOROLAC TROMETHAMINE 30 MG/ML IJ SOLN: 30.0000 mg | Freq: Once | INTRAMUSCULAR | Status: DC | PRN

## 2021-06-12 MED ORDER — SUCCINYLCHOLINE CHLORIDE 200 MG/10ML IV SOSY
PREFILLED_SYRINGE | INTRAVENOUS | Status: DC | PRN
  Administered 2021-06-12: 100 mg via INTRAVENOUS

## 2021-06-12 MED ORDER — ORAL CARE MOUTH RINSE: 15.0000 mL | Freq: Once | OROMUCOSAL | Status: AC

## 2021-06-12 MED ORDER — LACTATED RINGERS IV SOLN: INTRAVENOUS | Status: DC

## 2021-06-12 MED ORDER — GABAPENTIN 300 MG PO CAPS
300.0000 mg | ORAL_CAPSULE | ORAL | Status: AC
  Administered 2021-06-12: 300 mg via ORAL
  Filled 2021-06-12: qty 1

## 2021-06-12 MED ORDER — OXYCODONE HCL 5 MG/5ML PO SOLN: 5.0000 mg | Freq: Once | ORAL | Status: DC | PRN

## 2021-06-12 MED ORDER — OXYCODONE HCL 5 MG PO TABS: 5.0000 mg | ORAL_TABLET | Freq: Four times a day (QID) | ORAL | 0 refills | Status: DC | PRN

## 2021-06-12 MED ORDER — ENSURE PRE-SURGERY PO LIQD
296.0000 mL | Freq: Once | ORAL | Status: DC
  Filled 2021-06-12: qty 296

## 2021-06-12 MED ORDER — INDOCYANINE GREEN 25 MG IV SOLR
INTRAVENOUS | Status: DC | PRN
  Administered 2021-06-12: 5 mg via INTRAVENOUS

## 2021-06-12 MED ORDER — ONDANSETRON HCL 4 MG/2ML IJ SOLN
INTRAMUSCULAR | Status: DC | PRN
  Administered 2021-06-12: 4 mg via INTRAVENOUS

## 2021-06-12 MED ORDER — MIDAZOLAM HCL 2 MG/2ML IJ SOLN
INTRAMUSCULAR | Status: AC
  Filled 2021-06-12: qty 2

## 2021-06-12 SURGICAL SUPPLY — 60 items
ADH SKN CLS APL DERMABOND .7 (GAUZE/BANDAGES/DRESSINGS) ×1
APL PRP STRL LF DISP 70% ISPRP (MISCELLANEOUS) ×1
APL SKNCLS STERI-STRIP NONHPOA (GAUZE/BANDAGES/DRESSINGS)
APL SRG 38 LTWT LNG FL B (MISCELLANEOUS)
APPLICATOR ARISTA FLEXITIP XL (MISCELLANEOUS) IMPLANT
APPLIER CLIP 5 13 M/L LIGAMAX5 (MISCELLANEOUS) ×2
APPLIER CLIP ROT 10 11.4 M/L (STAPLE)
APR CLP MED LRG 11.4X10 (STAPLE)
APR CLP MED LRG 5 ANG JAW (MISCELLANEOUS) ×1
BAG COUNTER SPONGE SURGICOUNT (BAG) ×1 IMPLANT
BAG SPEC RTRVL 10 TROC 200 (ENDOMECHANICALS) ×1
BAG SPNG CNTER NS LX DISP (BAG) ×1
BENZOIN TINCTURE PRP APPL 2/3 (GAUZE/BANDAGES/DRESSINGS) IMPLANT
BNDG ADH 1X3 SHEER STRL LF (GAUZE/BANDAGES/DRESSINGS) ×8 IMPLANT
BNDG ADH THN 3X1 STRL LF (GAUZE/BANDAGES/DRESSINGS) ×4
CABLE HIGH FREQUENCY MONO STRZ (ELECTRODE) ×2 IMPLANT
CHLORAPREP W/TINT 26 (MISCELLANEOUS) ×2 IMPLANT
CLIP APPLIE 5 13 M/L LIGAMAX5 (MISCELLANEOUS) IMPLANT
CLIP APPLIE ROT 10 11.4 M/L (STAPLE) IMPLANT
CLIP LIGATING HEMO O LOK GREEN (MISCELLANEOUS) ×1 IMPLANT
COVER MAYO STAND STRL (DRAPES) IMPLANT
COVER SURGICAL LIGHT HANDLE (MISCELLANEOUS) ×2 IMPLANT
DECANTER SPIKE VIAL GLASS SM (MISCELLANEOUS) ×2 IMPLANT
DERMABOND ADVANCED (GAUZE/BANDAGES/DRESSINGS) ×1
DERMABOND ADVANCED .7 DNX12 (GAUZE/BANDAGES/DRESSINGS) IMPLANT
DRAPE C-ARM 42X120 X-RAY (DRAPES) IMPLANT
DRSG TEGADERM 2-3/8X2-3/4 SM (GAUZE/BANDAGES/DRESSINGS) IMPLANT
ELECT PENCIL ROCKER SW 15FT (MISCELLANEOUS) ×1 IMPLANT
ELECT REM PT RETURN 15FT ADLT (MISCELLANEOUS) ×2 IMPLANT
GAUZE SPONGE 2X2 8PLY STRL LF (GAUZE/BANDAGES/DRESSINGS) IMPLANT
GLOVE SRG 8 PF TXTR STRL LF DI (GLOVE) ×1 IMPLANT
GLOVE SURG MICRO LTX SZ7.5 (GLOVE) ×2 IMPLANT
GLOVE SURG UNDER POLY LF SZ8 (GLOVE) ×2
GOWN STRL REUS W/TWL XL LVL3 (GOWN DISPOSABLE) ×6 IMPLANT
GRASPER SUT TROCAR 14GX15 (MISCELLANEOUS) IMPLANT
HEMOSTAT ARISTA ABSORB 3G PWDR (HEMOSTASIS) IMPLANT
HEMOSTAT SNOW SURGICEL 2X4 (HEMOSTASIS) IMPLANT
KIT BASIN OR (CUSTOM PROCEDURE TRAY) ×2 IMPLANT
KIT TURNOVER KIT A (KITS) ×2 IMPLANT
L-HOOK LAP DISP 36CM (ELECTROSURGICAL) ×2
LHOOK LAP DISP 36CM (ELECTROSURGICAL) IMPLANT
POUCH RETRIEVAL ECOSAC 10 (ENDOMECHANICALS) ×1 IMPLANT
POUCH RETRIEVAL ECOSAC 10MM (ENDOMECHANICALS) ×2
SCISSORS LAP 5X35 DISP (ENDOMECHANICALS) ×2 IMPLANT
SET CHOLANGIOGRAPH MIX (MISCELLANEOUS) ×1 IMPLANT
SET IRRIG TUBING LAPAROSCOPIC (IRRIGATION / IRRIGATOR) ×2 IMPLANT
SET TUBE SMOKE EVAC HIGH FLOW (TUBING) ×2 IMPLANT
SLEEVE XCEL OPT CAN 5 100 (ENDOMECHANICALS) ×4 IMPLANT
SPONGE GAUZE 2X2 STER 10/PKG (GAUZE/BANDAGES/DRESSINGS)
STRIP CLOSURE SKIN 1/2X4 (GAUZE/BANDAGES/DRESSINGS) IMPLANT
SUT MNCRL AB 4-0 PS2 18 (SUTURE) ×2 IMPLANT
SUT VIC AB 0 UR5 27 (SUTURE) IMPLANT
SUT VICRYL 0 TIES 12 18 (SUTURE) IMPLANT
SUT VICRYL 0 UR6 27IN ABS (SUTURE) IMPLANT
TOWEL OR 17X26 10 PK STRL BLUE (TOWEL DISPOSABLE) ×2 IMPLANT
TOWEL OR NON WOVEN STRL DISP B (DISPOSABLE) ×2 IMPLANT
TRAY LAPAROSCOPIC (CUSTOM PROCEDURE TRAY) ×2 IMPLANT
TROCAR BLADELESS OPT 5 100 (ENDOMECHANICALS) ×2 IMPLANT
TROCAR XCEL BLUNT TIP 100MML (ENDOMECHANICALS) IMPLANT
TROCAR XCEL NON-BLD 11X100MML (ENDOMECHANICALS) IMPLANT

## 2021-06-12 NOTE — Op Note (Addendum)
PATIENT:  Casey Manning  25 y.o. female  PRE-OPERATIVE DIAGNOSIS:  SYMPTOMATIC CHOLELITHIASIS  POST-OPERATIVE DIAGNOSIS:  SYMPTOMATIC CHOLELITHIASIS  PROCEDURE:  Procedure(s): LAPAROSCOPIC CHOLECYSTECTOMY WITH INTRAOPERATIVE CHOLANGIOGRAM & ICG dye  SURGEON:  Gaynelle Adu, MD   ASSISTANT: Susa Day MD  ANESTHESIA:   local and general  Indications for procedure: Casey Manning is a 25 y.o. female with symptoms of Abdominal pain consistent with gallbladder disease, Confirmed by ultrasound.  Description of procedure: The patient was brought into the operative suite, placed supine. Anesthesia was administered with endotracheal tube. Patient was strapped in place and foot board was secured. All pressure points were offloaded by foam padding. The patient was prepped and draped in the usual sterile fashion. Patient was given ICG dye preoperatively.   In Hasson fashion, an infraumbilical incision was made, fascia identified with blunt dissection, the fascia was divided and trocar placed. A purse string suture was placed through the fascia. Two 5 mm trocars were placed on in the right lateral space on in the right subcostal space. A 81mm trocar was placed in the subxiphoid space. Marcaine with Epinephrine was infused to the subxiphoid space and lateral upper right abdomen in the transversus abdominis plane. Next the patient was placed in reverse trendelenberg. The gallbladder appearedminimally inflamed. Omentum was adhered to the gallbladder and was taken down with cautery/blunt dissection.  The gallbladder was retracted cephalad and lateral. The peritoneum was reflected off the infundibulum working lateral to medial. The cystic duct and cystic artery were identified and further dissection revealed a critical view, ICG dye visualization was utilized and we had a double confirmation of the the location of the cystic duct going into the gallbladder. A cholangiogram was performed with  ductotomy and cook catheter passed through a separate subcostal stab incision. Contrast was noted to go distally through the duodenum and proximally up through the hepatic duct. The cystic duct and cystic artery were doubly clipped and ligated.   The gallbladder was removed off the liver bed with cautery. The Gallbladder was placed in a specimen bag. The gallbladder fossa was irrigated and hemostasis was applied with cautery. The gallbladder was removed via the 21mm trocar. The fascial defect was closed with the purse string suture and an additional interrupted 0 vicryl suture via laparoscopic trans-fascial suture passer. Pneumoperitoneum was removed, all trocar were removed. All incisions were closed with 4-0 monocryl subcuticular stitch. The patient woke from anesthesia and was brought to PACU in stable condition. All counts were correct  Findings: Inflamed gallbladder  Specimen: gallbladder  Blood loss: 5 ml  Local anesthesia: 20 ml Marcaine with Epinephrine  Complications: No immediate complications  PLAN OF CARE: Discharge to home after PACU  PATIENT DISPOSITION:  PACU - hemodynamically stable.   [facility], [place]  I was present & scrubbed and present for the entire surgery with the exception of skin closure at the end and I was immediately available afterward procedure.  I have reviewed and agree & edited the operative note as documented by the resident.  Mary Sella. Andrey Campanile, MD, FACS General, Bariatric, & Minimally Invasive Surgery [facility], [place]

## 2021-06-12 NOTE — Interval H&P Note (Signed)
History and Physical Interval Note:  06/12/2021 11:21 AM  Casey Manning  has presented today for surgery, with the diagnosis of SYMPTOMATIC CHOLELITHIASIS.  The various methods of treatment have been discussed with the patient and family. After consideration of risks, benefits and other options for treatment, the patient has consented to  Procedure(s): LAPAROSCOPIC CHOLECYSTECTOMY WITH INTRAOPERATIVE CHOLANGIOGRAM (N/A) as a surgical intervention.  The patient's history has been reviewed, patient examined, no change in status, stable for surgery.  I have reviewed the patient's chart and labs.  Questions were answered to the patient's satisfaction.    Mary Sella. Andrey Campanile, MD, FACS General, Bariatric, & Minimally Invasive Surgery [facility] [place]   Gaynelle Adu

## 2021-06-12 NOTE — Anesthesia Procedure Notes (Addendum)
Procedure Name: Intubation Date/Time: 06/12/2021 2:28 PM Performed by: Cleda Daub, CRNA Pre-anesthesia Checklist: Patient identified, Emergency Drugs available, Suction available and Patient being monitored Patient Re-evaluated:Patient Re-evaluated prior to induction Oxygen Delivery Method: Circle system utilized Preoxygenation: Pre-oxygenation with 100% oxygen Induction Type: IV induction Ventilation: Mask ventilation without difficulty Laryngoscope Size: Mac and 3 Grade View: Grade I Tube type: Oral Tube size: 7.0 mm Number of attempts: 1 Airway Equipment and Method: Stylet and Oral airway Placement Confirmation: ETT inserted through vocal cords under direct vision, positive ETCO2 and breath sounds checked- equal and bilateral Secured at: 21 cm Tube secured with: Tape Dental Injury: Teeth and Oropharynx as per pre-operative assessment

## 2021-06-12 NOTE — Discharge Instructions (Addendum)
POST OP INSTRUCTIONS  DIET: As tolerated. Follow a light bland diet the first 24 hours after arrival home, such as soup, liquids, crackers, etc.  Be sure to include lots of fluids daily.  Avoid fast food or heavy meals as your are more likely to get nauseated.  Eat a low fat the next few days after surgery.  Take your usually prescribed home medications unless otherwise directed.  PAIN CONTROL: Pain is best controlled by a usual combination of three different methods TOGETHER: Ice/Heat Over the counter pain medication Prescription pain medication Most patients will experience some swelling and bruising around the surgical site.  Ice packs or heating pads (30-60 minutes up to 6 times a day) will help. Some people prefer to use ice alone, heat alone, alternating between ice & heat.  Experiment to what works for you.  Swelling and bruising can take several weeks to resolve.   It is helpful to take an over-the-counter pain medication regularly for the first few weeks: Ibuprofen (Motrin/Advil) - 200mg  tabs - take 3 tabs (600mg ) every 6 hours as needed for pain Acetaminophen (Tylenol) - you may take 650mg  every 6 hours as needed. You can take this with motrin as they act differently on the body. If you are taking a narcotic pain medication that has acetaminophen in it, do not take over the counter tylenol at the same time.  Iii. NOTE: You may take both of these medications together - most patients  find it most helpful when alternating between the two (i.e. Ibuprofen at 6am,  tylenol at 9am, ibuprofen at 12pm .. ) A  prescription for pain medication should be given to you upon discharge.  Take your pain medication as prescribed if your pain is not adequatly controlled with the over-the-counter pain reliefs mentioned above.  Avoid getting constipated.  Between the surgery and the pain medications, it is common to experience some constipation.  Increasing fluid intake and taking a fiber supplement (such as  Metamucil, Citrucel, FiberCon, MiraLax, etc) 1-2 times a day regularly will usually help prevent this problem from occurring.  A mild laxative (prune juice, Milk of Magnesia, MiraLax, etc) should be taken according to package directions if there are no bowel movements after 48 hours.    Dressing: Your incision are covered in dressing that is ok to bathe with in 2 days. Avoid baths/pools/lakes/oceans until your wounds have fully healed.  ACTIVITIES as tolerated:   Avoid heavy lifting (>10lbs or 1 gallon of milk) for the next 6 weeks. You may resume regular (light) daily activities beginning the next day--such as daily self-care, walking, climbing stairs--gradually increasing activities as tolerated.  If you can walk 30 minutes without difficulty, it is safe to try more intense activity such as jogging, treadmill, bicycling, low-impact aerobics.  DO NOT PUSH THROUGH PAIN.  Let pain be your guide: If it hurts to do something, don't do it. You may drive when you are no longer taking prescription pain medication, you can comfortably wear a seatbelt, and you can safely maneuver your car and apply brakes.   FOLLOW UP in our office Please call [facility] at [phone] to set up an appointment to see your surgeon in the office for a follow-up appointment approximately 2 weeks after your surgery. Make sure that you call for this appointment the day you arrive home to insure a convenient appointment time.  9. If you have disability or family leave forms that need to be completed, you may have them completed by your  primary care physician's office; for return to work instructions, please ask our office staff and they will be happy to assist you in obtaining this documentation   When to call us [phone]: Poor pain control Reactions / problems with new medications (rash/itching, etc)  Fever over 101.5 F (38.5 C) Inability to urinate Nausea/vomiting Worsening swelling or bruising Continued bleeding from  incision. Increased pain, redness, or drainage from the incision  The clinic staff is available to answer your questions during regular business hours (8:30am-5pm).  Please don't hesitate to call and ask to speak to one of our nurses for clinical concerns.   A surgeon from [facility] is always on call at the hospitals   If you have a medical emergency, go to the nearest emergency room or call 911.  [facility] [address], [address], [place], [place]  [address] MAIN: [phone] FAX: [phone] [email or web address]

## 2021-06-12 NOTE — Transfer of Care (Signed)
Immediate Anesthesia Transfer of Care Note  Patient: Casey Manning  Procedure(s) Performed: LAPAROSCOPIC CHOLECYSTECTOMY WITH INTRAOPERATIVE CHOLANGIOGRAM  Patient Location: PACU  Anesthesia Type:General  Level of Consciousness: awake, alert , oriented and patient cooperative  Airway & Oxygen Therapy: Patient Spontanous Breathing and Patient connected to face mask oxygen  Post-op Assessment: Report given to RN and Post -op Vital signs reviewed and stable  Post vital signs: Reviewed and stable  Last Vitals:  Vitals Value Taken Time  BP    Temp    Pulse    Resp    SpO2      Last Pain:  Vitals:   06/12/21 1109  PainSc: 0-No pain         Complications: No notable events documented.

## 2021-06-12 NOTE — H&P (Signed)
REFERRING PHYSICIAN: Willaim Bane,*  PROVIDER: Gerado Nabers Sherril Cong, MD  MRN: [id number] DOB: 1996-07-14 DATE OF ENCOUNTER: 06/01/2021  Subjective   Chief Complaint: Cholelithiasis   History of Present Illness: Casey Manning is a 25 y.o. female who is seen today as an office consultation at the request of Dr. Criss Alvine for evaluation of Cholelithiasis .  She is accompanied by her mother. She reports that she ate some shrimp 1 night around August 10 and later that night she experienced right upper quadrant pain rating to her side. It was pretty intense. She describes as a pressure sensation. It kept waking her up during the night. The following morning she had nausea and vomiting. She also had loose stool. She presented to the emergency room for she was evaluated. She had an ultrasound which demonstrated gallstones without any evidence of cholecystitis. Her labs were unremarkable. She was referred here for further evaluation. She denies any prior episodes. She has been trying to eat a bland diet. She is still been having some altered bowel movements. No prior abdominal surgeries. No vaginal discharge.  Review of Systems: A complete review of systems was obtained from the patient. I have reviewed this information and discussed as appropriate with the patient. See HPI as well for other ROS.  ROS   Medical History: No past medical history on file.  There is no problem list on file for this patient.  No past surgical history on file.   Allergies  Allergen Reactions   Citalopram Other (See Comments)  suicidal   Current Outpatient Medications on File Prior to Visit  Medication Sig Dispense Refill   rizatriptan (MAXALT) 10 MG tablet Take by mouth   topiramate (TOPAMAX) 25 MG tablet Take by mouth   No current facility-administered medications on file prior to visit.   No family history on file.   Social History   Tobacco Use  Smoking Status Never Smoker   Smokeless Tobacco Never Used    Social History   Socioeconomic History   Marital status: Single  Tobacco Use   Smoking status: Never Smoker   Smokeless tobacco: Never Used  Building services engineer Use: Never used  Substance and Sexual Activity   Alcohol use: Never   Drug use: Never   Objective:   Vitals:  06/01/21 1620  BP: 98/74  Pulse: 74  Temp: 36.4 C (97.6 F)  SpO2: 97%  Weight: (!) 108.4 kg (239 lb)  Height: 154.9 cm (5\' 1" )   Body mass index is 45.16 kg/m.  Constitutional: NAD; conversant; no deformities; class III obesity Eyes: Moist conjunctiva; no lid lag; anicteric; PERRL Neck: Trachea midline; no thyromegaly Lungs: Normal respiratory effort; no tactile fremitus CV: RRR; no palpable thrills; no pitting edema GI: Abd soft, nt, nd; no palpable hepatosplenomegaly MSK: Normal gait; no clubbing/cyanosis Psychiatric: Appropriate affect; alert and oriented x3 Lymphatic: No palpable cervical or axillary lymphadenopathy Skin:no rash/jaundice, lesions  Labs, Imaging and Diagnostic Testing: Ed records reviewed  Assessment and Plan:  Diagnoses and all orders for this visit:  Symptomatic cholelithiasis  Fatty liver  Obesity, Class III, BMI 40-49.9 (morbid obesity) (CMS-HCC)    I believe the patient's symptoms are consistent with gallbladder disease.  We discussed gallbladder disease. The patient was given . We discussed non-operative and operative management. We discussed the signs & symptoms of acute cholecystitis  I discussed laparoscopic cholecystectomy with possible IOC in detail. The patient was given educational material as well as diagrams detailing the procedure. We  discussed the risks and benefits of a laparoscopic cholecystectomy including, but not limited to bleeding, infection, injury to surrounding structures such as the intestine or liver, bile leak, retained gallstones, need to convert to an open procedure, prolonged diarrhea,  blood clots such as DVT, common bile duct injury, anesthesia risks, and possible need for additional procedures. We discussed the typical post-operative recovery course. I explained that the likelihood of improvement of their symptoms is good.  I also gave them a gallbladder booklet in Spanish for her mother.  Patient was to proceed with surgery  This patient encounter took 32 minutes today to perform the following: take history, perform exam, review outside records, interpret imaging, counsel the patient on their diagnosis and document encounter, findings & plan in the EHR  No follow-ups on file.  D'Arcy Abraha Sherril Cong, MD  General, Minimally Invasive, & Bariatric Surgery

## 2021-06-12 NOTE — Anesthesia Preprocedure Evaluation (Addendum)
Anesthesia Evaluation  Patient identified by MRN, date of birth, ID band Patient awake    Reviewed: Allergy & Precautions, NPO status , Patient's Chart, lab work & pertinent test results  Airway Mallampati: III  TM Distance: >3 FB Neck ROM: Full    Dental no notable dental hx. (+) Dental Advisory Given   Pulmonary neg pulmonary ROS,    Pulmonary exam normal breath sounds clear to auscultation       Cardiovascular negative cardio ROS Normal cardiovascular exam Rhythm:Regular Rate:Normal  ECG: rate 66   Neuro/Psych  Headaches, negative psych ROS   GI/Hepatic Neg liver ROS, GERD  ,  Endo/Other  Morbid obesity  Renal/GU negative Renal ROS     Musculoskeletal negative musculoskeletal ROS (+)   Abdominal   Peds  Hematology negative hematology ROS (+)   Anesthesia Other Findings SYMPTOMATIC CHOLELITHIASIS  Reproductive/Obstetrics hcg negative                           Anesthesia Physical Anesthesia Plan  ASA: 3  Anesthesia Plan: General   Post-op Pain Management:    Induction: Intravenous  PONV Risk Score and Plan: 4 or greater and Ondansetron, Dexamethasone, Midazolam, Treatment may vary due to age or medical condition and Scopolamine patch - Pre-op  Airway Management Planned: Oral ETT  Additional Equipment: None  Intra-op Plan:   Post-operative Plan: Extubation in OR  Informed Consent: I have reviewed the patients History and Physical, chart, labs and discussed the procedure including the risks, benefits and alternatives for the proposed anesthesia with the patient or authorized representative who has indicated his/her understanding and acceptance.     Dental advisory given  Plan Discussed with: CRNA  Anesthesia Plan Comments:       Anesthesia Quick Evaluation

## 2021-06-13 ENCOUNTER — Encounter (HOSPITAL_COMMUNITY): Payer: Self-pay | Admitting: General Surgery

## 2021-06-13 NOTE — Anesthesia Postprocedure Evaluation (Signed)
Anesthesia Post Note  Patient: Casey Manning  Procedure(s) Performed: LAPAROSCOPIC CHOLECYSTECTOMY WITH INTRAOPERATIVE CHOLANGIOGRAM     Patient location during evaluation: PACU Anesthesia Type: General Level of consciousness: sedated and patient cooperative Pain management: pain level controlled Vital Signs Assessment: post-procedure vital signs reviewed and stable Respiratory status: spontaneous breathing Cardiovascular status: stable Anesthetic complications: no   No notable events documented.  Last Vitals:  Vitals:   06/12/21 1715 06/12/21 1802  BP: 117/64 117/64  Pulse: 63 61  Resp: 15 14  Temp:    SpO2: 98% 97%    Last Pain:  Vitals:   06/12/21 1802  PainSc: Asleep                 Lewie Loron

## 2021-06-14 LAB — SURGICAL PATHOLOGY

## 2021-08-14 ENCOUNTER — Ambulatory Visit (INDEPENDENT_AMBULATORY_CARE_PROVIDER_SITE_OTHER): Payer: Medicaid Other

## 2021-08-14 ENCOUNTER — Ambulatory Visit (HOSPITAL_COMMUNITY)
Admission: EM | Admit: 2021-08-14 | Discharge: 2021-08-14 | Disposition: A | Discharge status: Home / Self Care | Payer: Medicaid Other | Attending: Physician Assistant | Admitting: Physician Assistant

## 2021-08-14 ENCOUNTER — Encounter (HOSPITAL_COMMUNITY): Payer: Self-pay | Admitting: Emergency Medicine

## 2021-08-14 ENCOUNTER — Emergency Department (HOSPITAL_COMMUNITY)
Admission: EM | Admit: 2021-08-14 | Discharge: 2021-08-14 | Discharge status: Against Medical Advice | Payer: Medicaid Other

## 2021-08-14 ENCOUNTER — Other Ambulatory Visit: Payer: Self-pay

## 2021-08-14 DIAGNOSIS — M545 Low back pain, unspecified: Secondary | ICD-10-CM | POA: Diagnosis not present

## 2021-08-14 DIAGNOSIS — G44319 Acute post-traumatic headache, not intractable: Secondary | ICD-10-CM

## 2021-08-14 MED ORDER — KETOROLAC TROMETHAMINE 30 MG/ML IJ SOLN
30.0000 mg | Freq: Once | INTRAMUSCULAR | Status: AC
  Administered 2021-08-14: 30 mg via INTRAMUSCULAR

## 2021-08-14 MED ORDER — TIZANIDINE HCL 4 MG PO CAPS: 4.0000 mg | ORAL_CAPSULE | Freq: Three times a day (TID) | ORAL | 0 refills | Status: AC

## 2021-08-14 MED ORDER — KETOROLAC TROMETHAMINE 30 MG/ML IJ SOLN
INTRAMUSCULAR | Status: AC
  Filled 2021-08-14: qty 1
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
## 2021-08-14 NOTE — ED Notes (Signed)
Pt left ED. Pt encouraged to stay. Pt declined.  
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
## 2021-08-14 NOTE — Discharge Instructions (Signed)
Your x-ray was normal.  We have given you Toradol in clinic so please do not take any NSAIDs including aspirin, ibuprofen/Advil, naproxen/Aleve for 24 hours.  You can use Tylenol for breakthrough pain.  Take Zanaflex up to 3 times a day as needed.  This make you sleepy do not drive or drink alcohol while taking it.  As we discussed, your blood pressure is low.  Please go home and eat salt as well as drink fluid.  Monitor this and if this is persistently below 90 on the top or 60 on the bottom you need to go to the emergency room.  If at any point you feel lightheaded or like you are going to pass out you have to go to the ER.  Follow-up with your primary care provider soon as possible.

## 2021-09-09 ENCOUNTER — Other Ambulatory Visit: Payer: Self-pay

## 2021-09-09 ENCOUNTER — Encounter (HOSPITAL_COMMUNITY): Payer: Self-pay

## 2021-09-09 ENCOUNTER — Ambulatory Visit (HOSPITAL_COMMUNITY)
Admission: EM | Admit: 2021-09-09 | Discharge: 2021-09-09 | Disposition: A | Discharge status: Home / Self Care | Payer: Medicaid Other | Attending: Emergency Medicine | Admitting: Emergency Medicine

## 2021-09-09 DIAGNOSIS — J101 Influenza due to other identified influenza virus with other respiratory manifestations: Secondary | ICD-10-CM

## 2021-09-09 LAB — POC INFLUENZA A AND B ANTIGEN (URGENT CARE ONLY)
INFLUENZA A ANTIGEN, POC: POSITIVE — AB
INFLUENZA B ANTIGEN, POC: NEGATIVE

## 2021-09-09 MED ORDER — OSELTAMIVIR PHOSPHATE 75 MG PO CAPS: 75.0000 mg | ORAL_CAPSULE | Freq: Two times a day (BID) | ORAL | 0 refills | Status: AC

## 2021-09-09 NOTE — Discharge Instructions (Addendum)
Your symptoms are most consistent with a viral upper respiratory illness.  Rapid influenza testing today was positive.   Please remain home from work, school, public places until you have been fever free for 24 hours without the use of antifever medications such as Tylenol or ibuprofen.  Conservative care is recommended at this time.  This includes rest, pushing clear fluids and activity as tolerated.  You may also noticed that your appetite is reduced, this is okay as long as they are drinking plenty of clear fluids.  Acetaminophen (Tylenol): This is a good fever reducer.  If there body temperature rises above 101.5 as measured with a thermometer, it is recommended that you give them 1,000 mg every 6-8 hours until they are temperature falls below 101.5, please not take more than 3,000 mg of acetaminophen either as a separate medication or as in ingredient in an over-the-counter cold/flu preparation within a 24-hour period  Ibuprofen  (Advil, Motrin): This is a good anti-inflammatory medication which addresses aches and pains and, to some degree, congestion in the nasal passages.  I recommend giving between 400 to 600 mg every 6-8 hours as needed.  Pseudoephedrine (Sudafed): This is a decongestant.  This medication has to be purchased from the pharmacist counter, I recommend giving 2 tablets, 60 mg, 2-3 times a day as needed to relieve runny nose and sinus drainage.  Guaifenesin (Robitussin, Mucinex): This is an expectorant.  This helps break up chest congestion and loosen up thick nasal drainage making phlegm and drainage more liquid and therefore easier to remove.  I recommend being 400 mg three times daily as needed.  Dextromethorphan (any cough medicine with the letters "DM" added to it's name such as Robitussin DM): This is a cough suppressant.  This is often recommended to be taken at nighttime to suppress cough and help children sleep.  Give dosage as directed on the bottle.   Chloraseptic  Throat Spray: Spray 5 sprays into affected area every 2 hours, hold for 15 seconds and either swallow or spit it out.  This is a excellent numbing medication because it is a spray, you can put it right where you needed and so sucking on a lozenge and numbing your entire mouth.  Based on my physical exam findings and the history provided  today, I do not see any evidence of bacterial infection therefore treatment with antibiotics would be of no benefit.  Please follow-up within the next 3 to 5 days either with your primary care provider or urgent care if your symptoms do not resolve.  If you do not have a primary care provider, we will assist you in finding one.

## 2021-09-09 NOTE — ED Provider Notes (Signed)
[facility]    CSN: [id number] Arrival date & time: 09/09/21  1027    HISTORY   Chief Complaint  Patient presents with   Cough   Fever    Pt presents to the office for fever and cough x days.   Nasal Congestion   HPI Casey Manning is a 25 y.o. female. Patient complains of a 2-day history of cough and congestion.  Patient states she is on a sinus pressure and postnasal drip, cough is productive of sputum.  Patient states she has body aches, feels tired and having subjective fevers and chills.  Patient states has not tried any medications for this.  Patient denies known sick contacts but states she was out shopping on black Friday and large crowds of people, did not mask.  The history is provided by the patient.  Past Medical History:  Diagnosis Date   GERD (gastroesophageal reflux disease)    Seasonal allergies    Patient Active Problem List   Diagnosis Date Noted   Marijuana use 07/31/2017   Migraine without aura and without status migrainosus, not intractable 06/13/2016   Past Surgical History:  Procedure Laterality Date   CHOLECYSTECTOMY N/A 06/12/2021   Procedure: LAPAROSCOPIC CHOLECYSTECTOMY WITH INTRAOPERATIVE CHOLANGIOGRAM;  Surgeon: Gaynelle Adu, MD;  Location: [facility];  Service: General;  Laterality: N/A;   NO PAST SURGERIES     OB History     Gravida  1   Para  1   Term  1   Preterm  0   AB  0   Living  1      SAB  0   IAB  0   Ectopic  0   Multiple      Live Births  1          Home Medications    Prior to Admission medications   Medication Sig Start Date End Date Taking? Authorizing Provider  oseltamivir (TAMIFLU) 75 MG capsule Take 1 capsule (75 mg total) by mouth every 12 (twelve) hours. 09/09/21  Yes Theadora Rama Scales, PA-C  rizatriptan (MAXALT) 10 MG tablet Take 10 mg by mouth as needed for migraine. 02/09/21   [provider]  tiZANidine (ZANAFLEX) 4 MG capsule Take 1 capsule (4 mg total) by  mouth 3 (three) times daily. 08/14/21   Raspet, Noberto Retort, PA-C  topiramate (TOPAMAX) 25 MG tablet Take 100 mg by mouth at bedtime. 02/09/21   [provider]   Family History Family History  Family history unknown: Yes   Social History Social History   Tobacco Use   Smoking status: Never   Smokeless tobacco: Never  Vaping Use   Vaping Use: Never used  Substance Use Topics   Alcohol use: Never   Drug use: Never    Types: Marijuana    Comment: none since delivery   Allergies   Citalopram  Review of Systems Review of Systems Pertinent findings noted in history of present illness.   Physical Exam Triage Vital Signs ED Triage Vitals  Enc Vitals Group     BP 08/10/21 0827 (!) 147/82     Pulse Rate 08/10/21 0827 72     Resp 08/10/21 0827 18     Temp 08/10/21 0827 98.3 F (36.8 C)     Temp Source 08/10/21 0827 Oral     SpO2 08/10/21 0827 98 %     Weight --      Height --      Head Circumference --  Peak Flow --      Pain Score 08/10/21 0826 5     Pain Loc --      Pain Edu? --      Excl. in GC? --   No data found.  Updated Vital Signs BP 105/83 (BP Location: Left Arm)   Pulse 98   Temp 99.5 F (37.5 C) (Oral)   Resp 16   SpO2 100%   Physical Exam Vitals and nursing note reviewed.  Constitutional:      General: She is not in acute distress.    Appearance: Normal appearance. She is not ill-appearing.  HENT:     Head: Normocephalic and atraumatic.     Salivary Glands: Right salivary gland is not diffusely enlarged or tender. Left salivary gland is not diffusely enlarged or tender.     Right Ear: Hearing, ear canal and external ear normal. No drainage. A middle ear effusion (Mild suppurative effusion) is present. There is no impacted cerumen. Tympanic membrane is erythematous and bulging.     Left Ear: Hearing, tympanic membrane, ear canal and external ear normal. No drainage.  No middle ear effusion. There is no impacted cerumen. Tympanic membrane is not  erythematous or bulging.     Nose: Mucosal edema, congestion and rhinorrhea present. No nasal deformity or septal deviation. Rhinorrhea is clear.     Right Turbinates: Enlarged. Not swollen or pale.     Left Turbinates: Enlarged. Not swollen or pale.     Right Sinus: Maxillary sinus tenderness present. No frontal sinus tenderness.     Left Sinus: Maxillary sinus tenderness present. No frontal sinus tenderness.     Mouth/Throat:     Lips: Pink. No lesions.     Mouth: Mucous membranes are moist. No oral lesions.     Pharynx: Oropharynx is clear. Uvula midline. No posterior oropharyngeal erythema or uvula swelling.     Tonsils: No tonsillar exudate. 0 on the right. 0 on the left.  Eyes:     General: Lids are normal.        Right eye: No discharge.        Left eye: No discharge.     Extraocular Movements: Extraocular movements intact.     Conjunctiva/sclera: Conjunctivae normal.     Right eye: Right conjunctiva is not injected.     Left eye: Left conjunctiva is not injected.  Neck:     Trachea: Trachea and phonation normal.  Cardiovascular:     Rate and Rhythm: Normal rate and regular rhythm.     Pulses: Normal pulses.     Heart sounds: Normal heart sounds. No murmur heard.   No friction rub. No gallop.  Pulmonary:     Effort: Pulmonary effort is normal. No accessory muscle usage, prolonged expiration or respiratory distress.     Breath sounds: Normal breath sounds. No stridor, decreased air movement or transmitted upper airway sounds. No decreased breath sounds, wheezing, rhonchi or rales.  Chest:     Chest wall: No tenderness.  Musculoskeletal:        General: Normal range of motion.     Cervical back: Normal range of motion and neck supple. Normal range of motion.  Lymphadenopathy:     Cervical: No cervical adenopathy.  Skin:    General: Skin is warm and dry.     Findings: No erythema or rash.  Neurological:     General: No focal deficit present.     Mental Status: She is  alert and oriented to  person, place, and time.  Psychiatric:        Mood and Affect: Mood normal.        Behavior: Behavior normal.    Visual Acuity Right Eye Distance:   Left Eye Distance:   Bilateral Distance:    Right Eye Near:   Left Eye Near:    Bilateral Near:     UC Couse / Diagnostics / Procedures:    EKG  Radiology No results found.  Procedures Procedures (including critical care time)  UC Diagnoses / Final Clinical Impressions(s)   I have reviewed the triage vital signs and the nursing notes.  Pertinent labs & imaging results that were available during my care of the patient were reviewed by me and considered in my medical decision making (see chart for details).   Final diagnoses:  Influenza A   Patient symptoms are consistent with viral upper respiratory infection.  Rapid flu test today is positive.  Tamiflu sent to the pharmacy conservative care recommended.  Disposition Upon Discharge:  Condition: stable for discharge home Home: take medications as prescribed; routine discharge instructions as discussed; follow up as advised.  Patient presented with an acute illness with associated systemic symptoms and significant discomfort requiring urgent management. In my opinion, this is a condition that a prudent lay person (someone who possesses an average knowledge of health and medicine) may potentially expect to result in complications if not addressed urgently such as respiratory distress, impairment of bodily function or dysfunction of bodily organs.   Routine symptom specific, illness specific and/or disease specific instructions were discussed with the patient and/or caregiver at length.   As such, the patient has been evaluated and assessed, work-up was performed and treatment was provided in alignment with urgent care protocols and evidence based medicine.  Patient/parent/caregiver has been advised that the patient may require follow up for further testing and  treatment if the symptoms continue in spite of treatment, as clinically indicated and appropriate.  The patient was tested for COVID-19, Influenza and/or RSV, then the patient/parent/guardian was advised to isolate at home pending the results of his/her diagnostic coronavirus test and potentially longer if they're positive. I have also advised pt that if his/her COVID-19 test returns positive, it's recommended to self-isolate for at least 10 days after symptoms first appeared AND until fever-free for 24 hours without fever reducer AND other symptoms have improved or resolved. Discussed self-isolation recommendations as well as instructions for household member/close contacts as per the [facility] and  [facility], and also gave patient the COVID packet with this information.  Patient/parent/caregiver has been advised to return to the [facility] or PCP in 3-5 days if no better; to PCP or the Emergency Department if new signs and symptoms develop, or if the current signs or symptoms continue to change or worsen for further workup, evaluation and treatment as clinically indicated and appropriate  The patient will follow up with their current PCP if and as advised. If the patient does not currently have a PCP we will assist them in obtaining one.   The patient may need specialty follow up if the symptoms continue, in spite of conservative treatment and management, for further workup, evaluation, consultation and treatment as clinically indicated and appropriate.  Patient/parent/caregiver verbalized understanding and agreement of plan as discussed.  All questions were addressed during visit.  Please see discharge instructions below for further details of plan.  ED Prescriptions     Medication Sig Dispense Auth. Provider   oseltamivir (TAMIFLU) 75 MG  capsule Take 1 capsule (75 mg total) by mouth every 12 (twelve) hours. 10 capsule Theadora Rama Scales, PA-C      PDMP not reviewed this encounter.  Pending results:   Labs Reviewed  POC INFLUENZA A AND B ANTIGEN (URGENT CARE ONLY) - Abnormal; Notable for the following components:      Result Value   INFLUENZA A ANTIGEN, POC POSITIVE (*)    All other components within normal limits    Medications Ordered in UC: Medications - No data to display  Discharge Instructions:   Discharge Instructions      Your symptoms are most consistent with a viral upper respiratory illness.  Rapid influenza testing today was positive.   Please remain home from work, school, public places until you have been fever free for 24 hours without the use of antifever medications such as Tylenol or ibuprofen.  Conservative care is recommended at this time.  This includes rest, pushing clear fluids and activity as tolerated.  You may also noticed that your appetite is reduced, this is okay as long as they are drinking plenty of clear fluids.  Acetaminophen (Tylenol): This is a good fever reducer.  If there body temperature rises above 101.5 as measured with a thermometer, it is recommended that you give them 1,000 mg every 6-8 hours until they are temperature falls below 101.5, please not take more than 3,000 mg of acetaminophen either as a separate medication or as in ingredient in an over-the-counter cold/flu preparation within a 24-hour period  Ibuprofen  (Advil, Motrin): This is a good anti-inflammatory medication which addresses aches and pains and, to some degree, congestion in the nasal passages.  I recommend giving between 400 to 600 mg every 6-8 hours as needed.  Pseudoephedrine (Sudafed): This is a decongestant.  This medication has to be purchased from the pharmacist counter, I recommend giving 2 tablets, 60 mg, 2-3 times a day as needed to relieve runny nose and sinus drainage.  Guaifenesin (Robitussin, Mucinex): This is an expectorant.  This helps break up chest congestion and loosen up thick nasal drainage making phlegm and drainage more liquid and therefore easier to  remove.  I recommend being 400 mg three times daily as needed.  Dextromethorphan (any cough medicine with the letters "DM" added to it's name such as Robitussin DM): This is a cough suppressant.  This is often recommended to be taken at nighttime to suppress cough and help children sleep.  Give dosage as directed on the bottle.   Chloraseptic Throat Spray: Spray 5 sprays into affected area every 2 hours, hold for 15 seconds and either swallow or spit it out.  This is a excellent numbing medication because it is a spray, you can put it right where you needed and so sucking on a lozenge and numbing your entire mouth.  Based on my physical exam findings and the history provided  today, I do not see any evidence of bacterial infection therefore treatment with antibiotics would be of no benefit.  Please follow-up within the next 3 to 5 days either with your primary care provider or urgent care if your symptoms do not resolve.  If you do not have a primary care provider, we will assist you in finding one.         Theadora Rama Scales, PA-C 09/09/21 1328
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
## 2021-09-09 NOTE — ED Triage Notes (Signed)
Patient presented to the office for cough and congestion x 2 days.

## 2021-09-11 ENCOUNTER — Telehealth: Payer: Self-pay | Admitting: General Practice

## 2021-09-11 DIAGNOSIS — Z975 Presence of (intrauterine) contraceptive device: Secondary | ICD-10-CM

## 2021-09-11 MED ORDER — MEDROXYPROGESTERONE ACETATE 10 MG PO TABS: 10.0000 mg | ORAL_TABLET | Freq: Every day | ORAL | 0 refills | Status: DC

## 2021-09-11 NOTE — Telephone Encounter (Signed)
Patient called into front office requesting a call back because the next available appointment isn't until January but she is bleeding all the time with her Nexplanon.   Called patient and she states for the last month she has had bleeding everyday, sometimes just spotting sometimes bleeding like a period. She states this has happened in the past with her last nexplanon but it eventually stopped. Per chart review patient has a history of migraines with aura. Per Dr Crissie Reese, patient could try provera 10mg  daily to help stop bleeding. Rx sent to pharmacy and patient informed. Patient verbalized understanding.

## 2021-10-08 ENCOUNTER — Other Ambulatory Visit: Payer: Self-pay | Admitting: Family Medicine

## 2021-10-08 DIAGNOSIS — Z975 Presence of (intrauterine) contraceptive device: Secondary | ICD-10-CM

## 2021-10-12 ENCOUNTER — Telehealth: Payer: Self-pay | Admitting: Lactation Services

## 2021-10-12 NOTE — Telephone Encounter (Signed)
Received a refill request for Provera. She reports that the bleeding has stopped and does not need at this time. Reviewed to call the office if bleeding resumes. Patient voiced understanding.

## 2021-12-18 ENCOUNTER — Telehealth: Payer: Self-pay | Admitting: Family Medicine

## 2021-12-18 ENCOUNTER — Other Ambulatory Visit: Payer: Self-pay | Admitting: Family Medicine

## 2021-12-18 DIAGNOSIS — N921 Excessive and frequent menstruation with irregular cycle: Secondary | ICD-10-CM

## 2021-12-18 DIAGNOSIS — Z975 Presence of (intrauterine) contraceptive device: Secondary | ICD-10-CM

## 2021-12-18 NOTE — Telephone Encounter (Signed)
Patient called and stated that the medication she has been receiving she is trying to refill but [facility] told her she had to reach out to the provider. She said she really needs this medication ASAP please. ?

## 2021-12-19 MED ORDER — MEDROXYPROGESTERONE ACETATE 10 MG PO TABS: 10.0000 mg | ORAL_TABLET | Freq: Every day | ORAL | 0 refills | Status: DC

## 2021-12-19 NOTE — Telephone Encounter (Signed)
Call returned to patient. Patient reports breakthrough bleeding on Nexplanon for last 2 weeks. States this happened once before (Novemeber 2022) and she was given Provera 10 mg tablets. Pt reports taking 3 tablets and then discontinuing medication because bleeding resolved. Pt discarded remaining tablets.  ? ?Reviewed with Edd Arbour, CNM who recommends Provera 10 mg daily #30. Pt to take daily until bleeding stops. May resume Provera tablets if bleeding begins at a later time. VM left for patient stating I am sending medication. MyChart message sent. ?

## 2022-01-03 ENCOUNTER — Telehealth: Payer: Self-pay | Admitting: Family Medicine

## 2022-01-03 ENCOUNTER — Telehealth: Payer: Self-pay | Admitting: General Practice

## 2022-01-03 DIAGNOSIS — N921 Excessive and frequent menstruation with irregular cycle: Secondary | ICD-10-CM

## 2022-01-03 MED ORDER — NAPROXEN SODIUM 550 MG PO TABS: 550.0000 mg | ORAL_TABLET | Freq: Two times a day (BID) | ORAL | 1 refills | Status: AC | PRN

## 2022-01-03 MED ORDER — DROSPIRENONE-ETHINYL ESTRADIOL 3-0.02 MG PO TABS: 1.0000 | ORAL_TABLET | Freq: Every day | ORAL | 2 refills | Status: AC

## 2022-01-03 NOTE — Telephone Encounter (Signed)
Patient called into front office requesting a call back due to being on her period for 2-3 months now.  ? ?Called patient and she states she has not stopped bleeding this whole time. Reports taking provera daily and it initially helped but now it is not anymore. She states she feels she is going crazy and her hormones are messed up. Patient states she cannot keep bleeding like this. Asked patient if she had thoughts of harming herself or others and she states no she has just felt depressed and crying a lot. Discussed with Dr Macon Large who recommends Yaz & naproxen Rx and follow up appt in office. Discussed with patient and scheduled appt for 4/3 @ 1115. Reviewed with patient behavorial health urgent care if she has thoughts of harming herself or others. Patient verbalized understanding to all.  ?

## 2022-01-03 NOTE — Telephone Encounter (Signed)
Patient called, she said she need a Rx for ibuprofen called in.  ?

## 2022-01-04 MED ORDER — NAPROXEN 500 MG PO TABS: 500.0000 mg | ORAL_TABLET | Freq: Two times a day (BID) | ORAL | 1 refills | Status: AC

## 2022-01-04 NOTE — Telephone Encounter (Signed)
Called pt. Pt states pharmacy told her that Medicaid will not cover Naproxen. Called pharmacy. Medicaid will only cover 500 mg tablets. Rx reordered and patient notified. ?

## 2022-01-11 ENCOUNTER — Telehealth: Payer: Self-pay

## 2022-01-11 NOTE — Telephone Encounter (Addendum)
Received notification from [employer] Med[employer] that pt needed PA for Naproxen 550 mg tablet.  Waiting for a approval from [employer] Med[employer].  ? ?PA approved via Cover My Meds.  Mel Almond, RN  ?01/11/22 ?

## 2022-01-14 ENCOUNTER — Ambulatory Visit: Payer: Medicaid Other | Admitting: Obstetrics and Gynecology

## 2022-01-17 ENCOUNTER — Telehealth: Payer: Self-pay | Admitting: Lactation Services

## 2022-01-17 NOTE — Telephone Encounter (Signed)
Received Medication refill request for Provera from Pharmacy. Per patient chart, medication has been changed. Patient cancelled last scheduled appointment reporting her bleeding has stopped.  ? ?Attempted to call patient to discuss and was not able to reach her or to leave a message. Will send My Chart message.  ? ?Called [facility] to inform then to cancel the Provera prescription.  ?

## 2022-03-13 ENCOUNTER — Ambulatory Visit: Payer: Medicaid Other | Admitting: Obstetrics & Gynecology

## 2022-08-09 ENCOUNTER — Encounter (HOSPITAL_BASED_OUTPATIENT_CLINIC_OR_DEPARTMENT_OTHER): Payer: Self-pay | Admitting: *Deleted

## 2022-08-09 ENCOUNTER — Other Ambulatory Visit: Payer: Self-pay

## 2022-08-09 ENCOUNTER — Emergency Department (HOSPITAL_BASED_OUTPATIENT_CLINIC_OR_DEPARTMENT_OTHER): Payer: Medicaid Other | Admitting: Radiology

## 2022-08-09 ENCOUNTER — Emergency Department (HOSPITAL_BASED_OUTPATIENT_CLINIC_OR_DEPARTMENT_OTHER)
Admission: EM | Admit: 2022-08-09 | Discharge: 2022-08-10 | Disposition: A | Discharge status: Home / Self Care | Payer: Medicaid Other | Attending: Emergency Medicine | Admitting: Emergency Medicine

## 2022-08-09 DIAGNOSIS — S20219A Contusion of unspecified front wall of thorax, initial encounter: Secondary | ICD-10-CM

## 2022-08-09 DIAGNOSIS — S161XXA Strain of muscle, fascia and tendon at neck level, initial encounter: Secondary | ICD-10-CM | POA: Diagnosis not present

## 2022-08-09 DIAGNOSIS — S199XXA Unspecified injury of neck, initial encounter: Secondary | ICD-10-CM | POA: Diagnosis present

## 2022-08-09 DIAGNOSIS — S301XXA Contusion of abdominal wall, initial encounter: Secondary | ICD-10-CM | POA: Insufficient documentation

## 2022-08-09 DIAGNOSIS — Z23 Encounter for immunization: Secondary | ICD-10-CM | POA: Diagnosis not present

## 2022-08-09 DIAGNOSIS — S8011XA Contusion of right lower leg, initial encounter: Secondary | ICD-10-CM | POA: Diagnosis not present

## 2022-08-09 DIAGNOSIS — S0081XA Abrasion of other part of head, initial encounter: Secondary | ICD-10-CM | POA: Diagnosis not present

## 2022-08-09 DIAGNOSIS — Y92410 Unspecified street and highway as the place of occurrence of the external cause: Secondary | ICD-10-CM | POA: Insufficient documentation

## 2022-08-09 DIAGNOSIS — S3011XA Contusion of abdominal wall, initial encounter: Secondary | ICD-10-CM

## 2022-08-09 LAB — CBC
HCT: 43.4 % (ref 36.0–46.0)
Hemoglobin: 14.5 g/dL (ref 12.0–15.0)
MCH: 28.1 pg (ref 26.0–34.0)
MCHC: 33.4 g/dL (ref 30.0–36.0)
MCV: 84.1 fL (ref 80.0–100.0)
Platelets: 310 10*3/uL (ref 150–400)
RBC: 5.16 MIL/uL — ABNORMAL HIGH (ref 3.87–5.11)
RDW: 13.4 % (ref 11.5–15.5)
WBC: 11 10*3/uL — ABNORMAL HIGH (ref 4.0–10.5)
nRBC: 0 % (ref 0.0–0.2)

## 2022-08-09 LAB — BASIC METABOLIC PANEL
Anion gap: 10 (ref 5–15)
BUN: 12 mg/dL (ref 6–20)
CO2: 24 mmol/L (ref 22–32)
Calcium: 9.2 mg/dL (ref 8.9–10.3)
Chloride: 105 mmol/L (ref 98–111)
Creatinine, Ser: 0.58 mg/dL (ref 0.44–1.00)
GFR, Estimated: >60 mL/min (ref >60)
Glucose, Bld: 91 mg/dL (ref 70–99)
Potassium: 3.5 mmol/L (ref 3.5–5.1)
Sodium: 139 mmol/L (ref 135–145)

## 2022-08-09 LAB — PREGNANCY, URINE: Preg Test, Ur: NEGATIVE

## 2022-08-09 MED ORDER — HYDROCODONE-ACETAMINOPHEN 5-325 MG PO TABS
2.0000 | ORAL_TABLET | Freq: Once | ORAL | Status: AC
  Administered 2022-08-09: 2 via ORAL
  Filled 2022-08-09: qty 2

## 2022-08-09 MED ORDER — TETANUS-DIPHTH-ACELL PERTUSSIS 5-2.5-18.5 LF-MCG/0.5 IM SUSY
0.5000 mL | PREFILLED_SYRINGE | Freq: Once | INTRAMUSCULAR | Status: AC
  Administered 2022-08-09: 0.5 mL via INTRAMUSCULAR
  Filled 2022-08-09: qty 0.5
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
## 2022-08-10 ENCOUNTER — Emergency Department (HOSPITAL_BASED_OUTPATIENT_CLINIC_OR_DEPARTMENT_OTHER): Payer: Medicaid Other

## 2022-08-10 ENCOUNTER — Encounter (HOSPITAL_BASED_OUTPATIENT_CLINIC_OR_DEPARTMENT_OTHER): Payer: Self-pay | Admitting: Radiology

## 2022-08-10 LAB — URINALYSIS, ROUTINE W REFLEX MICROSCOPIC
Bilirubin Urine: NEGATIVE
Glucose, UA: NEGATIVE mg/dL
Ketones, ur: NEGATIVE mg/dL
Nitrite: POSITIVE — AB
Protein, ur: 30 mg/dL — AB
Specific Gravity, Urine: 1.029 (ref 1.005–1.030)
pH: 6 (ref 5.0–8.0)

## 2022-08-10 MED ORDER — IOHEXOL 300 MG/ML  SOLN
100.0000 mL | Freq: Once | INTRAMUSCULAR | Status: AC | PRN
  Administered 2022-08-10: 100 mL via INTRAVENOUS

## 2022-08-10 MED ORDER — HYDROCODONE-ACETAMINOPHEN 5-325 MG PO TABS: 1.0000 | ORAL_TABLET | Freq: Four times a day (QID) | ORAL | 0 refills | Status: AC | PRN

## 2022-08-10 NOTE — Discharge Instructions (Signed)
Take ibuprofen 600 mg every 6 hours as needed for pain.  Begin taking hydrocodone as prescribed as needed for pain not relieved with ibuprofen.  Follow-up with primary doctor if not improving in the next few days, and return to the ER if symptoms significantly worsen or change.
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
## 2022-08-14 ENCOUNTER — Emergency Department (HOSPITAL_COMMUNITY)
Admission: EM | Admit: 2022-08-14 | Discharge: 2022-08-14 | Disposition: A | Discharge status: Home / Self Care | Payer: Medicaid Other | Attending: Emergency Medicine | Admitting: Emergency Medicine

## 2022-08-14 ENCOUNTER — Encounter (HOSPITAL_COMMUNITY): Payer: Self-pay

## 2022-08-14 ENCOUNTER — Other Ambulatory Visit: Payer: Self-pay

## 2022-08-14 ENCOUNTER — Emergency Department (HOSPITAL_COMMUNITY): Payer: Medicaid Other

## 2022-08-14 DIAGNOSIS — I959 Hypotension, unspecified: Secondary | ICD-10-CM | POA: Diagnosis not present

## 2022-08-14 DIAGNOSIS — E876 Hypokalemia: Secondary | ICD-10-CM | POA: Insufficient documentation

## 2022-08-14 DIAGNOSIS — R55 Syncope and collapse: Secondary | ICD-10-CM | POA: Insufficient documentation

## 2022-08-14 LAB — BASIC METABOLIC PANEL
Anion gap: 6 (ref 5–15)
BUN: 9 mg/dL (ref 6–20)
CO2: 19 mmol/L — ABNORMAL LOW (ref 22–32)
Calcium: 6.5 mg/dL — ABNORMAL LOW (ref 8.9–10.3)
Chloride: 115 mmol/L — ABNORMAL HIGH (ref 98–111)
Creatinine, Ser: 0.43 mg/dL — ABNORMAL LOW (ref 0.44–1.00)
GFR, Estimated: >60 mL/min (ref >60)
Glucose, Bld: 78 mg/dL (ref 70–99)
Potassium: 3 mmol/L — ABNORMAL LOW (ref 3.5–5.1)
Sodium: 140 mmol/L (ref 135–145)

## 2022-08-14 LAB — CBC WITH DIFFERENTIAL/PLATELET
Abs Immature Granulocytes: 0.01 10*3/uL (ref 0.00–0.07)
Basophils Absolute: 0 10*3/uL (ref 0.0–0.1)
Basophils Relative: 0 %
Eosinophils Absolute: 0.1 10*3/uL (ref 0.0–0.5)
Eosinophils Relative: 1 %
HCT: 40.1 % (ref 36.0–46.0)
Hemoglobin: 13.4 g/dL (ref 12.0–15.0)
Immature Granulocytes: 0 %
Lymphocytes Relative: 23 %
Lymphs Abs: 1.4 10*3/uL (ref 0.7–4.0)
MCH: 28.1 pg (ref 26.0–34.0)
MCHC: 33.4 g/dL (ref 30.0–36.0)
MCV: 84.1 fL (ref 80.0–100.0)
Monocytes Absolute: 0.4 10*3/uL (ref 0.1–1.0)
Monocytes Relative: 6 %
Neutro Abs: 4.3 10*3/uL (ref 1.7–7.7)
Neutrophils Relative %: 70 %
Platelets: 270 10*3/uL (ref 150–400)
RBC: 4.77 MIL/uL (ref 3.87–5.11)
RDW: 13.1 % (ref 11.5–15.5)
WBC: 6.1 10*3/uL (ref 4.0–10.5)
nRBC: 0 % (ref 0.0–0.2)

## 2022-08-14 LAB — PREGNANCY, URINE: Preg Test, Ur: NEGATIVE

## 2022-08-14 MED ORDER — SODIUM CHLORIDE 0.9 % IV BOLUS
1000.0000 mL | Freq: Once | INTRAVENOUS | Status: AC
  Administered 2022-08-14: 1000 mL via INTRAVENOUS

## 2022-08-14 NOTE — Discharge Instructions (Addendum)
Your labs and imaging were reassuring today. Please stay hydrated. I recommend close follow-up with PCP for reevaluation.  Please do not hesitate to return to emergency department if worrisome signs symptoms we discussed become apparent.

## 2022-08-14 NOTE — ED Provider Notes (Signed)
[facility] DEPT Provider Note   CSN: [id number] Arrival date & time: 08/14/22  1353     History  Chief Complaint  Patient presents with   Hypotension    Casey Manning is a 26 y.o. female history of migraine presenting to the emergency department for evaluation of syncope.  Patient states she was walking outside when she suddenly fainted and fell on the ground.  She was told that she lost consciousness for about 30 seconds and hit her head on the ground.  EMS arrived and her BP was 70/40.  She has a history of migraine headache taking sumatriptan as needed.  Patient had a small snack this morning.  Denies any chest pain, shortness of breath, nausea, vomiting, bowel changes, urinary symptoms.  HPI     Home Medications Prior to Admission medications   Medication Sig Start Date End Date Taking? Authorizing Provider  drospirenone-ethinyl estradiol (YAZ) 3-0.02 MG tablet Take 1 tablet by mouth daily. 01/03/22   Anyanwu, Sallyanne Havers, MD  HYDROcodone-acetaminophen (NORCO) 5-325 MG tablet Take 1-2 tablets by mouth every 6 (six) hours as needed. 08/10/22   Veryl Speak, MD  naproxen (NAPROSYN) 500 MG tablet Take 1 tablet (500 mg total) by mouth 2 (two) times daily with a meal. 01/04/22   Anyanwu, Sallyanne Havers, MD  naproxen sodium (ANAPROX) 550 MG tablet Take 1 tablet (550 mg total) by mouth 2 (two) times daily as needed. 01/03/22   Anyanwu, Sallyanne Havers, MD  oseltamivir (TAMIFLU) 75 MG capsule Take 1 capsule (75 mg total) by mouth every 12 (twelve) hours. 09/09/21   Lynden Oxford Scales, PA-C  rizatriptan (MAXALT) 10 MG tablet Take 10 mg by mouth as needed for migraine. 02/09/21   [provider]  tiZANidine (ZANAFLEX) 4 MG capsule Take 1 capsule (4 mg total) by mouth 3 (three) times daily. 08/14/21   Raspet, Derry Skill, PA-C  topiramate (TOPAMAX) 25 MG tablet Take 100 mg by mouth at bedtime. 02/09/21   [provider]      Allergies    Citalopram     Review of Systems   Review of Systems  Constitutional:  Positive for fatigue.    Physical Exam Updated Vital Signs BP 95/68 (BP Location: Left Arm)   Pulse 60   Temp 98.6 F (37 C) (Oral)   Resp 16   SpO2 99%  Physical Exam Vitals and nursing note reviewed.  Constitutional:      Appearance: Normal appearance.  HENT:     Head: Normocephalic and atraumatic.     Mouth/Throat:     Mouth: Mucous membranes are moist.  Eyes:     General: No scleral icterus. Cardiovascular:     Rate and Rhythm: Normal rate and regular rhythm.     Pulses: Normal pulses.     Heart sounds: Normal heart sounds.  Pulmonary:     Effort: Pulmonary effort is normal.     Breath sounds: Normal breath sounds.  Abdominal:     General: Abdomen is flat.     Palpations: Abdomen is soft.     Tenderness: There is no abdominal tenderness.  Musculoskeletal:        General: No deformity.  Skin:    General: Skin is warm.     Findings: No rash.  Neurological:     General: No focal deficit present.     Mental Status: She is alert.  Psychiatric:        Mood and Affect: Mood normal.  ED Results / Procedures / Treatments   Labs (all labs ordered are listed, but only abnormal results are displayed) Labs Reviewed - No data to display  EKG None  Radiology No results found.  Procedures Procedures    Medications Ordered in ED Medications - No data to display  ED Course/ Medical Decision Making/ A&P                           Medical Decision Making Amount and/or Complexity of Data Reviewed Labs: ordered. Radiology: ordered.   This patient presents to the ED for concern of syncope, this involves an extensive number of treatment options, and is a complaint that carries with a high risk of complications and morbidity.  The differential diagnosis includes hypoglycemia, CVA, electrolyte disturbances, pregnancy hypotension.  This is not an exhaustive list.  Comorbidities that complicate the  patient evaluation See HPI  Social determinants of health NA  Additional history obtained: Additional history obtained from EMR. External records from outside source obtained and review including prior labs  Cardiac monitoring/EKG: The patient was maintained on a cardiac monitor.  I personally reviewed and interpreted the cardiac monitor which showed an underlying rhythm of: Sinus rhythm.  Lab tests: I ordered and personally interpreted labs.  The pertinent results include WBC normal. Hbg/Hct normal. Platelets normal. Hypokalemia 3.0. BUN, creatinine normal. No transaminitis.  Imaging studies: I ordered imaging studies including CT head without any evidence of acute abnormalities.. I personally reviewed, interpreted imaging and agree with the radiologist's interpretations.  Problem list/ ED course/ Critical interventions/ Medical management: HPI: See above Vital signs significant for BP 95/68. Otherwise within normal range and stable throughout visit. Laboratory/imaging studies significant for: See above. On physical examination, patient is afebrile and appears in no acute distress.  Patient is petite.  Blood pressure with EMS 70/40 she was given 500 cc normal saline with EMS.  On ED arrival I ordered 1 L of normal saline bolus.  Labs was unremarkable.  EKG with no ischemic changes.  CT scan with no acute abnormalities.  Patient's clinical presentations and laboratory/imaging studies are most concerned for hypotension.  Low suspicions for ACS as EKG has no acute ischemic changes.  Low suspicions for ACS as CT scan was negative. 4:06 PM Reassessed patient. She has felt slightly better. BP at recheck 97/60. She is half way through her bolus. Signed off patient at shift change for further monitoring of blood pressure. 1L NS bolus ordered. Reevaluation of the patient after these medications showed that the patient improved.   I have reviewed the patient home medication medicines and  have him make adjustments as needed.  Consultations obtained: NA  Disposition Singed off care at shift change to Peyton Najjar, [place]   This chart was dictated using voice recognition software.  Despite best efforts to proofread,  errors can occur which can change the documentation meaning.          Final Clinical Impression(s) / ED Diagnoses Final diagnoses:  Syncope, unspecified syncope type    Rx / DC Orders ED Discharge Orders     None         Jeanelle Malling, [place] 08/14/22 2021    Tegeler, Canary Brim, MD 08/15/22 [phone]

## 2022-08-14 NOTE — ED Triage Notes (Addendum)
Patient BIB [facility] from home. Patient had a migraine last night took sumatriptan and oxycodone. Today felt dehydrated when she called EMS and said she was out in the sun today. EMS arrived and her BP was 70/40. Patient not in any pain.   EMS gave 581mL fluid

## 2022-08-14 NOTE — ED Provider Notes (Signed)
Patient care taken over at shift handoff from outgoing provider  In short patient is a 26 year old female with history of migraines who presented for evaluation of syncope.  Patient was walking outside when she felt lightheaded and fell to the ground.  She reported loss consciousness for 30 seconds and hit her head on the ground.  Upon EMS arrival her blood pressure was 70/40.  Patient endorses having just a small snack to eat today.  Patient denying chest pain, shortness of breath, nausea, vomiting, bowel changes, urinary symptoms Physical Exam  BP 95/68 (BP Location: Left Arm)   Pulse 60   Temp 98.6 F (37 C) (Oral)   Resp 16   SpO2 99%   Physical Exam  Procedures  Procedures  ED Course / MDM    Medical Decision Making Amount and/or Complexity of Data Reviewed Labs: ordered. Radiology: ordered.   Urinary pregnancy test was negative.  Patient mildly hypokalemic with a potassium of 3.0.  Other labs are unremarkable.  CT head was reviewed and was unremarkable  Patient was reevaluated after 1 L normal saline bolus.  Blood pressure has normalized at this time.  Most recent blood pressure 111/66.  The patient feels much better.  There is no indication for further work-up at this time.  She may discharge home.       Dorothyann Peng, PA-C 08/14/22 1650    Regan Lemming, MD 08/14/22 2250

## 2023-06-18 ENCOUNTER — Encounter (HOSPITAL_COMMUNITY): Payer: Self-pay

## 2023-06-18 ENCOUNTER — Ambulatory Visit (HOSPITAL_COMMUNITY)
Admission: RE | Admit: 2023-06-18 | Discharge: 2023-06-18 | Disposition: A | Discharge status: Home / Self Care | Payer: Medicaid Other | Source: Ambulatory Visit | Attending: Family Medicine | Admitting: Family Medicine

## 2023-06-18 VITALS — BP 118/60 | HR 75 | Temp 98.5°F | Resp 16 | Ht 63.0 in | Wt 240.0 lb

## 2023-06-18 DIAGNOSIS — Z1152 Encounter for screening for COVID-19: Secondary | ICD-10-CM | POA: Insufficient documentation

## 2023-06-18 DIAGNOSIS — H9201 Otalgia, right ear: Secondary | ICD-10-CM | POA: Diagnosis present

## 2023-06-18 DIAGNOSIS — R1084 Generalized abdominal pain: Secondary | ICD-10-CM | POA: Diagnosis present

## 2023-06-18 DIAGNOSIS — J069 Acute upper respiratory infection, unspecified: Secondary | ICD-10-CM | POA: Insufficient documentation

## 2023-06-18 MED ORDER — PROMETHAZINE-DM 6.25-15 MG/5ML PO SYRP: 5.0000 mL | ORAL_SOLUTION | Freq: Four times a day (QID) | ORAL | 0 refills | Status: AC | PRN

## 2023-06-18 MED ORDER — BENZONATATE 100 MG PO CAPS: 100.0000 mg | ORAL_CAPSULE | Freq: Three times a day (TID) | ORAL | 0 refills | Status: AC

## 2023-06-18 MED ORDER — IPRATROPIUM BROMIDE 0.03 % NA SOLN: 2.0000 | Freq: Two times a day (BID) | NASAL | 12 refills | Status: AC

## 2023-06-18 NOTE — ED Provider Notes (Signed)
[facility]    CSN: [id number] Arrival date & time: 06/18/23  1526      History   Chief Complaint Chief Complaint  Patient presents with   Otalgia    HPI Casey Manning is a 27 y.o. female.   Patient presents for evaluation of nasal congestion, rhinorrhea, right-sided ear pain, productive cough and intermittent generalized abdominal pain beginning 1 day ago.  No known sick contacts prior.  Decreased appetite but tolerating some food and liquids.  Has attempted use of hot tea and honey which has not been effective.  Denies respiratory history.  Denies fever chills body aches, shortness of breath, wheezing, nausea vomiting and diarrhea.     Past Medical History:  Diagnosis Date   GERD (gastroesophageal reflux disease)    Seasonal allergies     Patient Active Problem List   Diagnosis Date Noted   Marijuana use 07/31/2017   Migraine without aura and without status migrainosus, not intractable 06/13/2016    Past Surgical History:  Procedure Laterality Date   CHOLECYSTECTOMY N/A 06/12/2021   Procedure: LAPAROSCOPIC CHOLECYSTECTOMY WITH INTRAOPERATIVE CHOLANGIOGRAM;  Surgeon: Gaynelle Adu, MD;  Location: [facility];  Service: General;  Laterality: N/A;   NO PAST SURGERIES      OB History     Gravida  1   Para  1   Term  1   Preterm  0   AB  0   Living  1      SAB  0   IAB  0   Ectopic  0   Multiple      Live Births  1            Home Medications    Prior to Admission medications   Medication Sig Start Date End Date Taking? Authorizing Provider  drospirenone-ethinyl estradiol (YAZ) 3-0.02 MG tablet Take 1 tablet by mouth daily. 01/03/22   Anyanwu, Jethro Bastos, MD  HYDROcodone-acetaminophen (NORCO) 5-325 MG tablet Take 1-2 tablets by mouth every 6 (six) hours as needed. 08/10/22   Geoffery Lyons, MD  naproxen (NAPROSYN) 500 MG tablet Take 1 tablet (500 mg total) by mouth 2 (two) times daily with a meal. 01/04/22   Anyanwu, Jethro Bastos, MD   naproxen sodium (ANAPROX) 550 MG tablet Take 1 tablet (550 mg total) by mouth 2 (two) times daily as needed. 01/03/22   Anyanwu, Jethro Bastos, MD  oseltamivir (TAMIFLU) 75 MG capsule Take 1 capsule (75 mg total) by mouth every 12 (twelve) hours. 09/09/21   Theadora Rama Scales, PA-C  rizatriptan (MAXALT) 10 MG tablet Take 10 mg by mouth as needed for migraine. 02/09/21   [provider]  tiZANidine (ZANAFLEX) 4 MG capsule Take 1 capsule (4 mg total) by mouth 3 (three) times daily. 08/14/21   Raspet, Noberto Retort, PA-C  topiramate (TOPAMAX) 25 MG tablet Take 100 mg by mouth at bedtime. 02/09/21   [provider]    Family History Family History  Family history unknown: Yes    Social History Social History   Tobacco Use   Smoking status: Never   Smokeless tobacco: Never  Vaping Use   Vaping status: Never Used  Substance Use Topics   Alcohol use: Never   Drug use: Not Currently    Types: Marijuana    Comment: none since delivery     Allergies   Citalopram   Review of Systems Review of Systems  Constitutional: Negative.   HENT:  Positive for congestion, ear pain and rhinorrhea. Negative  for dental problem, drooling, ear discharge, facial swelling, hearing loss, mouth sores, nosebleeds, postnasal drip, sinus pressure, sinus pain, sneezing, sore throat, tinnitus, trouble swallowing and voice change.   Respiratory:  Positive for cough. Negative for apnea, choking, chest tightness, shortness of breath, wheezing and stridor.   Gastrointestinal:  Positive for abdominal pain. Negative for abdominal distention, anal bleeding, blood in stool, constipation, diarrhea, nausea, rectal pain and vomiting.  Musculoskeletal: Negative.   Skin: Negative.      Physical Exam Triage Vital Signs ED Triage Vitals  Encounter Vitals Group     BP 06/18/23 1604 118/60     Systolic BP Percentile --      Diastolic BP Percentile --      Pulse Rate 06/18/23 1604 75     Resp 06/18/23 1604 16      Temp 06/18/23 1604 98.5 F (36.9 C)     Temp Source 06/18/23 1604 Oral     SpO2 06/18/23 1604 98 %     Weight 06/18/23 1603 240 lb (108.9 kg)     Height 06/18/23 1603 5\' 3"  (1.6 m)     Head Circumference --      Peak Flow --      Pain Score 06/18/23 1602 6     Pain Loc --      Pain Education --      Exclude from Growth Chart --    No data found.  Updated Vital Signs BP 118/60 (BP Location: Left Arm)   Pulse 75   Temp 98.5 F (36.9 C) (Oral)   Resp 16   Ht 5\' 3"  (1.6 m)   Wt 240 lb (108.9 kg)   LMP 05/19/2023 (Approximate)   SpO2 98%   BMI 42.51 kg/m   Visual Acuity Right Eye Distance:   Left Eye Distance:   Bilateral Distance:    Right Eye Near:   Left Eye Near:    Bilateral Near:     Physical Exam Constitutional:      Appearance: Normal appearance.  HENT:     Head: Normocephalic.     Right Ear: Tympanic membrane, ear canal and external ear normal.     Left Ear: Tympanic membrane, ear canal and external ear normal.     Nose: Congestion and rhinorrhea present.     Mouth/Throat:     Pharynx: No oropharyngeal exudate or posterior oropharyngeal erythema.  Cardiovascular:     Rate and Rhythm: Normal rate and regular rhythm.     Pulses: Normal pulses.     Heart sounds: Normal heart sounds.  Pulmonary:     Effort: Pulmonary effort is normal.     Breath sounds: Normal breath sounds.  Musculoskeletal:     Cervical back: Normal range of motion and neck supple.  Skin:    General: Skin is warm and dry.  Neurological:     Mental Status: She is alert and oriented to person, place, and time. Mental status is at baseline.      UC Treatments / Results  Labs (all labs ordered are listed, but only abnormal results are displayed) Labs Reviewed - No data to display  EKG   Radiology No results found.  Procedures Procedures (including critical care time)  Medications Ordered in UC Medications - No data to display  Initial Impression / Assessment and Plan /  UC Course  I have reviewed the triage vital signs and the nursing notes.  Pertinent labs & imaging results that were available during my care of the  patient were reviewed by me and considered in my medical decision making (see chart for details).  Viral URI with cough  Patient is in no signs of distress nor toxic appearing.  Vital signs are stable.  Low suspicion for pneumonia, pneumothorax or bronchitis and therefore will defer imaging.  No abnormality to the bilateral tympanic membranes or ear canal, ear pain most likely result of nasal congestion, discussed this with patient.COVID test is pending, reviewed quarantine guidelines per CDC recommendations, young healthy adult without comorbidities, does not qualify for antivirals.Prescribed Atrovent nasal spray, Tessalon and Promethazine DM.May use additional over-the-counter medications as needed for supportive care.  May follow-up with urgent care as needed if symptoms persist or worsen.  Note given.    Final Clinical Impressions(s) / UC Diagnoses   Final diagnoses:  None   Discharge Instructions   None    ED Prescriptions   None    PDMP not reviewed this encounter.   Valinda Hoar, [place] 06/18/23 [phone]

## 2023-06-18 NOTE — Discharge Instructions (Signed)
Your symptoms today are most likely being caused by a virus and should steadily improve in time it can take up to 7 to 10 days before you truly start to see a turnaround however things will get better  COVID test is pending up to 24 hours, you will be notified of positive results only, if positive you will need to quarantine if experiencing fever, if no fever you may continue activity wearing mask until symptoms go away  You may use Tessalon pill every 8 hours to calm coughing  You may use cough syrup every 6 hours for additional comfort, be mindful this will make you feel sleepy  You may use nasal spray twice daily to help clear out sinuses which are most likely the cause of your ear pain  You can take Tylenol and/or Ibuprofen as needed for fever reduction and pain relief.   For cough: honey 1/2 to 1 teaspoon (you can dilute the honey in water or another fluid).  You can also use guaifenesin and dextromethorphan for cough. You can use a humidifier for chest congestion and cough.  If you don't have a humidifier, you can sit in the bathroom with the hot shower running.      For sore throat: try warm salt water gargles, cepacol lozenges, throat spray, warm tea or water with lemon/honey, popsicles or ice, or OTC cold relief medicine for throat discomfort.   For congestion: take a daily anti-histamine like Zyrtec, Claritin, and a oral decongestant, such as pseudoephedrine.  You can also use Flonase 1-2 sprays in each nostril daily.   It is important to stay hydrated: drink plenty of fluids (water, gatorade/powerade/pedialyte, juices, or teas) to keep your throat moisturized and help further relieve irritation/discomfort.

## 2023-06-18 NOTE — ED Triage Notes (Signed)
Patient here right ear pain, nasal drainage, congestion, and cough X 2 days. No sick contacts. No recent travel.

## 2023-06-19 LAB — SARS CORONAVIRUS 2 (TAT 6-24 HRS): SARS Coronavirus 2: NEGATIVE

## 2024-01-28 ENCOUNTER — Ambulatory Visit: Payer: Medicaid Other | Admitting: Obstetrics & Gynecology

## 2024-01-28 ENCOUNTER — Encounter: Payer: Self-pay | Admitting: Obstetrics & Gynecology

## 2024-01-28 ENCOUNTER — Other Ambulatory Visit: Payer: Self-pay

## 2024-01-28 VITALS — BP 106/64 | HR 66 | Ht 63.0 in | Wt 231.7 lb

## 2024-01-28 DIAGNOSIS — Z3046 Encounter for surveillance of implantable subdermal contraceptive: Secondary | ICD-10-CM

## 2024-01-28 NOTE — Progress Notes (Signed)
 GYNECOLOGY OFFICE PROCEDURE NOTE  Casey Manning is a 28 y.o. G1P1001 here for Nexplanon removal.  No other gynecologic concerns.  Nexplanon Removal Patient identified, informed consent performed, consent signed.   Appropriate time out taken. Nexplanon site identified.  Area prepped in usual sterile fashon. One ml of 1% lidocaine was used to anesthetize the area at the distal end of the implant. A small stab incision was made right beside the implant on the distal portion.  The Nexplanon rod was grasped using hemostats and removed without difficulty.  There was minimal blood loss. There were no complications.  3 ml of 1% lidocaine was injected around the incision for post-procedure analgesia.  Steri-strips were applied over the small incision.  A pressure bandage was applied to reduce any bruising.  The patient tolerated the procedure well and was given post procedure instructions.  Patient is planning to use condoms for contraception.    Casey Fruit, MD Attending Obstetrician & Gynecologist, [facility]  01/28/2024
